# Patient Record
Sex: Male | Born: 1935 | Race: White | Hispanic: No | Marital: Married | Smoking: Current every day smoker
Health system: Southern US, Community
[De-identification: ages and names within clinical notes are randomized; demographics above are authoritative.]

## PROBLEM LIST (undated history)

## (undated) DIAGNOSIS — I3139 Other pericardial effusion (noninflammatory): Secondary | ICD-10-CM

## (undated) DIAGNOSIS — I10 Essential (primary) hypertension: Secondary | ICD-10-CM

## (undated) DIAGNOSIS — I313 Pericardial effusion (noninflammatory): Secondary | ICD-10-CM

## (undated) DIAGNOSIS — C8590 Non-Hodgkin lymphoma, unspecified, unspecified site: Secondary | ICD-10-CM

## (undated) DIAGNOSIS — I639 Cerebral infarction, unspecified: Secondary | ICD-10-CM

## (undated) DIAGNOSIS — I2510 Atherosclerotic heart disease of native coronary artery without angina pectoris: Secondary | ICD-10-CM

## (undated) DIAGNOSIS — C439 Malignant melanoma of skin, unspecified: Secondary | ICD-10-CM

## (undated) DIAGNOSIS — M5136 Other intervertebral disc degeneration, lumbar region: Secondary | ICD-10-CM

## (undated) DIAGNOSIS — N400 Enlarged prostate without lower urinary tract symptoms: Secondary | ICD-10-CM

## (undated) DIAGNOSIS — E785 Hyperlipidemia, unspecified: Secondary | ICD-10-CM

## (undated) DIAGNOSIS — I779 Disorder of arteries and arterioles, unspecified: Secondary | ICD-10-CM

## (undated) DIAGNOSIS — N189 Chronic kidney disease, unspecified: Secondary | ICD-10-CM

## (undated) DIAGNOSIS — I739 Peripheral vascular disease, unspecified: Secondary | ICD-10-CM

## (undated) DIAGNOSIS — M51369 Other intervertebral disc degeneration, lumbar region without mention of lumbar back pain or lower extremity pain: Secondary | ICD-10-CM

## (undated) HISTORY — DX: Other pericardial effusion (noninflammatory): I31.39

## (undated) HISTORY — DX: Peripheral vascular disease, unspecified: I73.9

## (undated) HISTORY — DX: Non-Hodgkin lymphoma, unspecified, unspecified site: C85.90

## (undated) HISTORY — DX: Benign prostatic hyperplasia without lower urinary tract symptoms: N40.0

## (undated) HISTORY — PX: CORONARY ANGIOPLASTY WITH STENT PLACEMENT: SHX49

## (undated) HISTORY — DX: Essential (primary) hypertension: I10

## (undated) HISTORY — PX: CHOLECYSTECTOMY: SHX55

## (undated) HISTORY — DX: Other intervertebral disc degeneration, lumbar region without mention of lumbar back pain or lower extremity pain: M51.369

## (undated) HISTORY — PX: PERICARDIAL WINDOW: SHX2213

## (undated) HISTORY — DX: Pericardial effusion (noninflammatory): I31.3

## (undated) HISTORY — DX: Disorder of arteries and arterioles, unspecified: I77.9

## (undated) HISTORY — DX: Atherosclerotic heart disease of native coronary artery without angina pectoris: I25.10

## (undated) HISTORY — DX: Hyperlipidemia, unspecified: E78.5

## (undated) HISTORY — DX: Cerebral infarction, unspecified: I63.9

## (undated) HISTORY — DX: Other intervertebral disc degeneration, lumbar region: M51.36

## (undated) HISTORY — DX: Chronic kidney disease, unspecified: N18.9

---

## 2003-05-24 HISTORY — PX: OTHER SURGICAL HISTORY: SHX169

## 2011-04-20 ENCOUNTER — Telehealth: Payer: Self-pay | Admitting: Oncology

## 2011-04-20 NOTE — Telephone Encounter (Signed)
S/w pt and his wife re appt for 11/30 @ 1:30 pm w/FS.

## 2011-04-21 ENCOUNTER — Encounter: Payer: Self-pay | Admitting: *Deleted

## 2011-04-21 ENCOUNTER — Encounter: Payer: Self-pay | Admitting: Cardiology

## 2011-04-21 ENCOUNTER — Ambulatory Visit (INDEPENDENT_AMBULATORY_CARE_PROVIDER_SITE_OTHER): Payer: Medicare HMO | Admitting: Cardiology

## 2011-04-21 VITALS — BP 148/53 | HR 68 | Ht 70.0 in | Wt 168.1 lb

## 2011-04-21 DIAGNOSIS — I10 Essential (primary) hypertension: Secondary | ICD-10-CM

## 2011-04-21 DIAGNOSIS — E782 Mixed hyperlipidemia: Secondary | ICD-10-CM

## 2011-04-21 DIAGNOSIS — I2510 Atherosclerotic heart disease of native coronary artery without angina pectoris: Secondary | ICD-10-CM | POA: Insufficient documentation

## 2011-04-21 DIAGNOSIS — I779 Disorder of arteries and arterioles, unspecified: Secondary | ICD-10-CM | POA: Insufficient documentation

## 2011-04-21 DIAGNOSIS — I739 Peripheral vascular disease, unspecified: Secondary | ICD-10-CM

## 2011-04-21 NOTE — Assessment & Plan Note (Signed)
Reportedly nonobstructive based on testing back in May of this year. Continue medical therapy at this time. Would anticipate a followup carotid Doppler in approximately 6 months.

## 2011-04-21 NOTE — Assessment & Plan Note (Signed)
Not reporting a progressive claudication, despite what sounds like history of graft failure and possibly stent occlusion involving the right leg. He is status post aortobifemoral bypass grafting as well as femoral to popliteal bypass grafting. We will continue medical therapy and consider followup arterial Dopplers with ABIs in approximately 6 months.

## 2011-04-21 NOTE — Assessment & Plan Note (Signed)
Has been symptomatically stable for some time now based on his report. ECG is normal today. Medications are reasonable. He reports having a nuclear stress test within the last 2 years, and therefore observation will be continued at this time without additional testing. Six-month visit to be scheduled.

## 2011-04-21 NOTE — Assessment & Plan Note (Signed)
Blood pressure mildly elevated today. No changes made to current regimen. Sodium restriction recommended. Continue follow up with primary care.

## 2011-04-21 NOTE — Assessment & Plan Note (Signed)
Recent lipid numbers reviewed and LDL is at goal.

## 2011-04-21 NOTE — Progress Notes (Signed)
Clinical Summary [name] is a 77 y.o.male presenting to establish cardiology followup. He and his wife have recently moved from [place] to the [place] area to be closer to family. Medical history is fairly complex, and is outlined below following review of available records.  From a cardiac perspective, [name] has done relatively well following interventions back in 2005. He states that he had a followup nuclear stress test within the last one to 2 years. He denies any angina, and describes NYHA class II dyspnea on exertion.  Recent lab work showed hemoglobin 13.6, platelets 173, potassium 5.4, BUN 31, creatinine 1.9, AST 20, ALT 10, cholesterol 146, triglycerides 243, HDL 29, LDL 68.  He has a history of elevated PSA, and has pending follow up with a urologist locally. He is also being referred for oncology followup of his non-Hodgkin's lymphoma. He states that he has not undergone any specific treatment for this.  Remarkably, he reports no major claudication symptoms. States that he has a "occluded stent" clearly involving right-sided leg grafting.  He was hospitalized back in May of this year with a TIA that was ultimately managed medically. He underwent carotid Dopplers at that time and also a CT angiogram that ultimately showed no obstructive disease to require revascularization. He has been on Plavix long-term.  No Known Allergies  Medication list reviewed.  Past Medical History  Diagnosis Date  . Coronary atherosclerosis of native coronary artery     Occluded RCA with collaterals, DES to LAD and circumflex 2005  . Essential hypertension, benign   . Non Hodgkin's lymphoma   . Chronic renal insufficiency   . Hyperlipidemia   . Degenerative disc disease, lumbar   . Duodenal ulcer     EGD 6/11 - iron deficiency anemia  . Pericardial effusion     Status post window  . Carotid artery disease     Nonobstructive, less than 50% LICA by angiography 5/12  . Stroke    Occurred 1999, had left atrial appendage thrombus at that time - previously on Coumadin  . Peripheral arterial disease     Past Surgical History  Procedure Date  . Cholecystectomy   . Pericardial window   . Aortobifemoral bypass grafting 2005  . Left femoral popliteal bypass grafting 2005    Family History  Problem Relation Age of Onset  . COPD Mother   . Coronary artery disease Mother     Social History [name] reports that he has quit smoking. His smoking use included Cigarettes. He has never used smokeless tobacco. [name] reports that he does not drink alcohol.  Review of Systems No palpitations, no falls or syncope. Reports stable appetite. No fevers or chills, no cough. No reported bleeding problems. Otherwise negative.  Physical Examination Filed Vitals:   04/21/11 0902  BP: 148/53  Pulse: 68   Chronically ill-appearing elderly male in no acute distress. HEENT: Conjunctiva and lids normal, oropharynx with moist mucosa. Neck: Supple, no elevated JVP, bilateral carotid bruits are noted, no thyromegaly. Lungs: Decreased breath sounds, nonlabored breathing. Cardiac: Regular rate and rhythm with 2/6 systolic murmur, no pericardial rub or S3 gallop. Abdomen: Protuberant, bowel sounds present, no rebound or guarding. Extremities: No pitting edema, distal pulses diminished, venous stasis and some cyanosis noted involving the feet. Skin: Warm and dry. Musculoskeletal: Mild kyphosis. Neuropsychiatric: Alert and oriented x3, affect appropriate.  ECG Normal sinus rhythm at 68.    Problem List and Plan

## 2011-04-21 NOTE — Patient Instructions (Signed)
**Note De-identified Cornelius Marullo Obfuscation** Your physician recommends that you continue on your current medications as directed. Please refer to the Current Medication list given to you today.  Your physician recommends that you schedule a follow-up appointment in: 6 months  

## 2011-04-22 ENCOUNTER — Other Ambulatory Visit (HOSPITAL_COMMUNITY): Payer: Self-pay | Admitting: Urology

## 2011-04-22 ENCOUNTER — Encounter: Payer: Self-pay | Admitting: Cardiology

## 2011-04-22 ENCOUNTER — Other Ambulatory Visit: Payer: Self-pay

## 2011-04-22 ENCOUNTER — Encounter: Payer: Self-pay | Admitting: Oncology

## 2011-04-22 ENCOUNTER — Ambulatory Visit: Payer: Self-pay

## 2011-04-22 DIAGNOSIS — N2889 Other specified disorders of kidney and ureter: Secondary | ICD-10-CM

## 2011-04-22 NOTE — Progress Notes (Signed)
This encounter was created in error - please disregard.

## 2011-04-28 ENCOUNTER — Ambulatory Visit (HOSPITAL_COMMUNITY)
Admission: RE | Admit: 2011-04-28 | Discharge: 2011-04-28 | Disposition: A | Discharge status: Home / Self Care | Payer: Medicare HMO | Source: Ambulatory Visit | Attending: Urology | Admitting: Urology

## 2011-04-28 ENCOUNTER — Ambulatory Visit (HOSPITAL_COMMUNITY): Payer: Medicare HMO

## 2011-04-28 DIAGNOSIS — R9389 Abnormal findings on diagnostic imaging of other specified body structures: Secondary | ICD-10-CM | POA: Insufficient documentation

## 2011-04-28 DIAGNOSIS — N289 Disorder of kidney and ureter, unspecified: Secondary | ICD-10-CM | POA: Insufficient documentation

## 2011-04-28 DIAGNOSIS — N2889 Other specified disorders of kidney and ureter: Secondary | ICD-10-CM

## 2011-04-28 DIAGNOSIS — I10 Essential (primary) hypertension: Secondary | ICD-10-CM | POA: Insufficient documentation

## 2011-04-28 DIAGNOSIS — R935 Abnormal findings on diagnostic imaging of other abdominal regions, including retroperitoneum: Secondary | ICD-10-CM | POA: Insufficient documentation

## 2011-04-28 DIAGNOSIS — Z87898 Personal history of other specified conditions: Secondary | ICD-10-CM | POA: Insufficient documentation

## 2011-08-03 ENCOUNTER — Encounter (HOSPITAL_COMMUNITY): Payer: Self-pay | Admitting: Oncology

## 2011-08-03 ENCOUNTER — Encounter (HOSPITAL_COMMUNITY): Payer: Medicare HMO | Attending: Oncology | Admitting: Oncology

## 2011-08-03 VITALS — BP 164/56 | HR 76 | Temp 98.0°F | Ht 68.5 in | Wt 171.6 lb

## 2011-08-03 DIAGNOSIS — C8588 Other specified types of non-Hodgkin lymphoma, lymph nodes of multiple sites: Secondary | ICD-10-CM

## 2011-08-03 DIAGNOSIS — C8590 Non-Hodgkin lymphoma, unspecified, unspecified site: Secondary | ICD-10-CM

## 2011-08-03 NOTE — Progress Notes (Signed)
This is a debilitated 77 year old Caucasian gentleman who was diagnosed with low-grade B-cell follicular lymphoma in 2006 and was followed at the [facility] [place] in [place] by Dr. Salli Real. He has no B. symptomatology at this point in time either but has a question of partial hydronephrosis of the kidney with decreasing renal function. His most recent creatinine was 2.23 and a BUN was 36.  He is referred today through the courtesy of Dr. Lynnea Ferrier of [facility]  He had a normal CBC and except for the creatinine and BUN mentioned above on 07/20/2011. His cmet did not include an LDH. His oncologic review of systems is negative. His social history is positive for his first wife dying from metastatic breast cancer and his present wife and he had been married 32 years. They have no children but he has 4 children by his first marriage and they live in [place]. He is not especially close to them emotionally for various reasons.  He still smokes half a pack of cigarettes a day but has smoked as much as a pack a day for many many years. He does not use alcohol presently.  His physical exam today reveals a weight of 171 pounds and a height of 5 feet 8-1/2 inches. He is in no acute distress but he is sitting in a wheelchair. He has no radial pulse on the left wrist. He has a skin lesion that has been biopsied on the right forearm but he states was a melanoma. He has an epitrochlear lymph node on the right that is 1 x 1 cm. He has he has a 1 x 0.5 cm lymph node at the base of the left neck posteriorly in the left subclavicular/cervical fossa. I did not discover other lymph nodes. He had no hepatosplenomegaly. His lungs showed markedly diminished breath sounds. His heart showed a regular rhythm and rate without murmur rub or gallop. He had no gynecomastia. His lungs showed no edema. He had multiple abdominal scars from prior surgeries including abdominal aortic aneurysm bypass surgery. Bowel  sounds were normal to slightly diminished. He had upper and lower dental plates. His pupils did not respond to light were small but EOMs were intact. He was alert and oriented. Pulses in his feet were diminished to absent.  This gentleman may have slight hydronephrosis on the left after my discussion with Dr. Kearney Hard in radiology. A CAT scan would be ideal for further assessment but not absolutely critical. We may just repeat an ultrasound of his abdomen in the future but I would be very reluctant to treat him with chemotherapy unless it is absolutely necessary.  We will see him back in 6 months for followup with laboratory work at that time including CBC cmet LDH  I suspect his COPD and his vascular disease or for greater concerns from a medical standpoint that his lymphoma.

## 2011-08-03 NOTE — Patient Instructions (Signed)
Ricardo Schaefer  [id number] 1926-06-27   [facility]  Discharge Instructions  RECOMMENDATIONS MADE BY THE CONSULTANT AND ANY TEST RESULTS WILL BE SENT TO YOUR REFERRING DOCTOR.   EXAM FINDINGS BY MD TODAY AND SIGNS AND SYMPTOMS TO REPORT TO CLINIC OR PRIMARY MD: Will just monitor you over time.    MEDICATIONS PRESCRIBED: none   INSTRUCTIONS GIVEN AND DISCUSSED: Other :  Report any night sweats, new lumps, shortness of breath, recurrent infections, etc.  SPECIAL INSTRUCTIONS/FOLLOW-UP: Lab work Needed in 6 months and Return to Clinic to see MD in 6 months.   I acknowledge that I have been informed and understand all the instructions given to me and received a copy. I do not have any more questions at this time, but understand that I may call the Specialty Clinic at [facility] at [phone] during business hours should I have any further questions or need assistance in obtaining follow-up care.    __________________________________________  _____________  __________ Signature of Patient or Authorized Representative            Date                   Time    __________________________________________ Nurse's Signature

## 2011-09-20 ENCOUNTER — Emergency Department (HOSPITAL_COMMUNITY)
Admission: EM | Admit: 2011-09-20 | Discharge: 2011-09-20 | Disposition: A | Discharge status: Home / Self Care | Payer: Medicare HMO | Attending: Emergency Medicine | Admitting: Emergency Medicine

## 2011-09-20 ENCOUNTER — Encounter (HOSPITAL_COMMUNITY): Payer: Self-pay | Admitting: *Deleted

## 2011-09-20 DIAGNOSIS — I129 Hypertensive chronic kidney disease with stage 1 through stage 4 chronic kidney disease, or unspecified chronic kidney disease: Secondary | ICD-10-CM | POA: Insufficient documentation

## 2011-09-20 DIAGNOSIS — R011 Cardiac murmur, unspecified: Secondary | ICD-10-CM | POA: Insufficient documentation

## 2011-09-20 DIAGNOSIS — I2510 Atherosclerotic heart disease of native coronary artery without angina pectoris: Secondary | ICD-10-CM | POA: Insufficient documentation

## 2011-09-20 DIAGNOSIS — IMO0002 Reserved for concepts with insufficient information to code with codable children: Secondary | ICD-10-CM | POA: Insufficient documentation

## 2011-09-20 DIAGNOSIS — Z8673 Personal history of transient ischemic attack (TIA), and cerebral infarction without residual deficits: Secondary | ICD-10-CM | POA: Insufficient documentation

## 2011-09-20 DIAGNOSIS — Z79899 Other long term (current) drug therapy: Secondary | ICD-10-CM | POA: Insufficient documentation

## 2011-09-20 DIAGNOSIS — N189 Chronic kidney disease, unspecified: Secondary | ICD-10-CM | POA: Insufficient documentation

## 2011-09-20 DIAGNOSIS — R197 Diarrhea, unspecified: Secondary | ICD-10-CM | POA: Insufficient documentation

## 2011-09-20 DIAGNOSIS — R110 Nausea: Secondary | ICD-10-CM

## 2011-09-20 DIAGNOSIS — F17200 Nicotine dependence, unspecified, uncomplicated: Secondary | ICD-10-CM | POA: Insufficient documentation

## 2011-09-20 LAB — URINALYSIS, ROUTINE W REFLEX MICROSCOPIC
Bilirubin Urine: NEGATIVE
Glucose, UA: NEGATIVE mg/dL
Hgb urine dipstick: NEGATIVE
Ketones, ur: NEGATIVE mg/dL
Leukocytes, UA: NEGATIVE
Nitrite: NEGATIVE
Protein, ur: NEGATIVE mg/dL
Specific Gravity, Urine: <1.005 — ABNORMAL LOW (ref 1.005–1.030)
Urobilinogen, UA: 0.2 mg/dL (ref 0.0–1.0)
pH: 6 (ref 5.0–8.0)

## 2011-09-20 LAB — CBC
HCT: 38 % — ABNORMAL LOW (ref 39.0–52.0)
Hemoglobin: 12.6 g/dL — ABNORMAL LOW (ref 13.0–17.0)
MCH: 29.9 pg (ref 26.0–34.0)
MCHC: 33.2 g/dL (ref 30.0–36.0)
MCV: 90 fL (ref 78.0–100.0)
Platelets: 204 10*3/uL (ref 150–400)
RBC: 4.22 MIL/uL (ref 4.22–5.81)
RDW: 13.6 % (ref 11.5–15.5)
WBC: 6.5 10*3/uL (ref 4.0–10.5)

## 2011-09-20 LAB — BASIC METABOLIC PANEL
BUN: 29 mg/dL — ABNORMAL HIGH (ref 6–23)
CO2: 25 mEq/L (ref 19–32)
Calcium: 10.3 mg/dL (ref 8.4–10.5)
Chloride: 105 mEq/L (ref 96–112)
Creatinine, Ser: 1.78 mg/dL — ABNORMAL HIGH (ref 0.50–1.35)
GFR calc Af Amer: 39 mL/min — ABNORMAL LOW (ref >90)
GFR calc non Af Amer: 34 mL/min — ABNORMAL LOW (ref >90)
Glucose, Bld: 99 mg/dL (ref 70–99)
Potassium: 5 mEq/L (ref 3.5–5.1)
Sodium: 139 mEq/L (ref 135–145)

## 2011-09-20 MED ORDER — ONDANSETRON HCL 4 MG PO TABS: 4.0000 mg | ORAL_TABLET | Freq: Four times a day (QID) | ORAL | Status: AC

## 2011-09-20 MED ORDER — SODIUM CHLORIDE 0.9 % IV BOLUS (SEPSIS)
1000.0000 mL | Freq: Once | INTRAVENOUS | Status: AC
  Administered 2011-09-20: 1000 mL via INTRAVENOUS

## 2011-09-20 NOTE — ED Notes (Signed)
Diarrhea for weeks, states he was told to come in for fluids, also is nauseated

## 2011-09-20 NOTE — Discharge Instructions (Signed)
Chronic Diarrhea Diarrhea is loose, watery stools. Having diarrhea means passing loose stools 3 or more times a day. Diarrhea that lasts longer than 4 weeks is considered long-lasting (chronic). Symptoms of chronic diarrhea may be continual or may come and go. People of all ages can get diarrhea. Body fluid loss (dehydration) may occur as a result of diarrhea. This means the body does not have as many fluids and salts (electrolytes) as it needs. CAUSES  There are many causes of chronic diarrhea. Causes may be different for children and adults. The various causes can be grouped into 2 categories: diarrhea caused by an infection and diarrhea not caused by an infection. Sometimes, the cause is unknown. Diarrhea caused by an infection may result from:  Parasites.   Bacteria.   Viral infections.  Diarrhea not caused by an infection may result from:  Irritable bowel syndrome.   Reaction to medicines, such as antibiotics, cancer drugs, blood pressure medicines, and antacids.   Intestinal disease (Crohn's disease, ulcerative colitis, celiac disease).   Food allergies or sensitivity to additives (fructose, lactose, sugar substitutes).   Tumors.   Diabetes, thyroid disease, and other endocrine diseases.   Reduced blood flow to the intestine.   Previous surgery or radiation of the abdomen or gastrointestinal tract.  Risk factors for chronic diarrhea include:  Having a severely weakened immune system, such as from HIV/AIDS.   Taking certain types of cancer-fighting drugs (chemotherapy) or other medicines.   A recent organ transplant.   Having a portion of the stomach removed.   Traveling to countries where food and water supplies are often contaminated.  SYMPTOMS  In addition to frequent, loose stools, diarrhea may cause:  Cramping.   Abdominal pain.   Nausea.   Urgent need to use the bathroom, or loss of bowel control.  If dehydration occurs, problems include:  Thirst.    Less frequent urination.   Dark urine.   Dry skin.   Fatigue.   Dizziness.  Infections that cause diarrhea may also cause a fever, chills, or bloody stools. DIAGNOSIS  Diagnosis may be difficult. Your caregiver must take a careful history and perform a physical exam. Tests given are based on your symptoms and history. Tests may include:  Blood or stool tests, in which 3 or more stool samples may be examined. Stool cultures may be used to test for bacteria or parasites.   X-rays.   A procedure in which a thin tube is inserted into the mouth or rectum (endoscopy). This allows the caregiver to look inside the intestine.  TREATMENT   Diarrhea caused by an infection can often be treated with antibiotics.   Diarrhea not caused by an infection is more difficult to diagnose and treat. Long-term medicine use or surgery may be required. Specific treatment should be discussed with your caregiver.   If the cause cannot determined, treatment to relieve symptoms includes:   Preventing dehydration. Serious health problems can occur if you do not maintain proper fluid levels. Many oral rehydration solutions (ORS) are available at drug stores. Ask your caregiver what product is best for you.   Not drinking beverages that contain caffeine (tea, coffee, soft drinks).   Not drinking alcohol. It causes dehydration.   Not relying on sports drinks and broths alone to maintain proper fluid levels. They should not be used to prevent severe dehydration.   Maintaining well-balanced nutrition. This may help you recover faster.  PREVENTION   Drink clean or purified water.   Use proper   food handling techniques.   Maintain proper hand-washing habits.  HOME CARE INSTRUCTIONS   Avoid:   Caffeine.   Greasy foods.   High fiber.   If you have problems digesting lactose during or after an episode of diarrhea, you might want to try yogurt. Yogurt is often better tolerated, because it has less  lactose than milk. Yogurt with active, live bacterial cultures may even help you recover faster.  SEEK MEDICAL CARE IF:  The person with diarrhea is an otherwise healthy adult and has:  Signs of dehydration.   Diarrhea for more than 2 days.   Severe pain in the abdomen or rectum.   An oral temperature above 102 F (38.9 C).   Stools containing blood or pus.   Stools that are black and tarry.  SEEK IMMEDIATE MEDICAL CARE IF:  The person with diarrhea is a child, elderly person, or has a weakened immune system and has:  Signs of dehydration.   Diarrhea for more than 1 day.   Severe pain in the abdomen or rectum.   An oral temperature above 102 F (38.9 C), not controlled by medicine.   Stools containing blood or pus.   Stools that are black and tarry.  Document Released: 07/30/2003 Document Revised: 04/28/2011 Document Reviewed: 09/25/2009 [employer] Patient Information 2012 [employer]. 

## 2011-09-20 NOTE — ED Notes (Signed)
Pt is aware of needing a urine sample, pt states he is unable to void at this time. RN aware.

## 2011-09-20 NOTE — ED Provider Notes (Signed)
History    85yM with diarrhea. Onset around April 6th. Persistent since then. Has been evaluated by PCP for same and referred to GI but has yet to be seen by them. No blood in stool or melena. No fever or chills. Says has tried two different medications for his diarrhea but can't remember names. No sick contacts. No vomiting but did start having nausea today. Doesn't think has been on abx recently. No recent hospital admissions. Surgical hx significant for chole and fem bypass.  CSN: [id number]  Arrival date & time 09/20/11  1600   First MD Initiated Contact with Patient 09/20/11 1625      Chief Complaint  Patient presents with  . Diarrhea    (Consider location/radiation/quality/duration/timing/severity/associated sxs/prior treatment) HPI  Past Medical History  Diagnosis Date  . Coronary atherosclerosis of native coronary artery     Occluded RCA with collaterals, DES to LAD and circumflex 2005  . Essential hypertension, benign   . Non Hodgkin's lymphoma   . Chronic renal insufficiency   . Hyperlipidemia   . Degenerative disc disease, lumbar   . Duodenal ulcer     EGD 6/11 - iron deficiency anemia  . Pericardial effusion     Status post window  . Carotid artery disease     Nonobstructive, less than 50% LICA by angiography 5/12  . Stroke     Occurred 1999, had left atrial appendage thrombus at that time - previously on Coumadin  . Peripheral arterial disease   . Enlarged prostate     Past Surgical History  Procedure Date  . Cholecystectomy   . Pericardial window   . Aortobifemoral bypass grafting 2005  . Left femoral popliteal bypass grafting 2005  . Coronary angioplasty with stent placement     Family History  Problem Relation Age of Onset  . COPD Mother   . Coronary artery disease Mother     History  Substance Use Topics  . Smoking status: Current Everyday Smoker -- 0.5 packs/day    Types: Cigarettes  . Smokeless tobacco: Never Used  . Alcohol Use: No       Review of Systems   Review of symptoms negative unless otherwise noted in HPI.   Allergies  Review of patient's allergies indicates no known allergies.  Home Medications   Current Outpatient Rx  Name Route Sig Dispense Refill  . ALPRAZOLAM 0.5 MG PO TABS Oral Take 0.5 mg by mouth 3 (three) times daily.      . CHLORPHENIRAMINE MALEATE 4 MG PO TABS Oral Take 4 mg by mouth daily.    Marland Kitchen CLOPIDOGREL BISULFATE 75 MG PO TABS Oral Take 75 mg by mouth every morning.     Marland Kitchen HYDRALAZINE HCL 25 MG PO TABS Oral Take 25 mg by mouth 2 (two) times daily.    Marland Kitchen HYDROCODONE-ACETAMINOPHEN 5-500 MG PO TABS Oral Take 1 tablet by mouth every 6 (six) hours as needed. For pain    . LOVASTATIN 40 MG PO TABS Oral Take 40 mg by mouth at bedtime.     Marland Kitchen METOPROLOL TARTRATE 50 MG PO TABS Oral Take 50 mg by mouth 2 (two) times daily.     Marland Kitchen NIFEDIPINE ER OSMOTIC 60 MG PO TB24 Oral Take 60 mg by mouth every morning.     Marland Kitchen PANTOPRAZOLE SODIUM 40 MG PO TBEC Oral Take 40 mg by mouth every morning.     Marland Kitchen TAMSULOSIN HCL 0.4 MG PO CAPS Oral Take 0.4 mg by mouth daily after supper.     Marland Kitchen  VENLAFAXINE HCL ER 150 MG PO CP24 Oral Take 150 mg by mouth every morning.       BP 155/35  Pulse 69  Temp(Src) 98.4 F (36.9 C) (Oral)  Resp 18  Ht 5\' 9"  (1.753 m)  Wt 167 lb (75.751 kg)  BMI 24.66 kg/m2  SpO2 94%  Physical Exam  Nursing note and vitals reviewed. Constitutional: He appears well-developed and well-nourished. No distress.       Laying in bed. No acute distress.  HENT:  Head: Normocephalic and atraumatic.  Right Ear: External ear normal.  Left Ear: External ear normal.  Eyes: Conjunctivae are normal. Pupils are equal, round, and reactive to light. Right eye exhibits no discharge. Left eye exhibits no discharge.  Neck: Neck supple.  Cardiovascular: Normal rate and regular rhythm.  Exam reveals no gallop and no friction rub.   Murmur heard.      Systolic murmur  Pulmonary/Chest: Effort normal and breath  sounds normal. No respiratory distress.  Abdominal: Soft. He exhibits no distension. There is no tenderness.       Well-healed transverse and vertical abdominal surgical incisions. No distention. No tenderness. No mass palpated  Musculoskeletal: He exhibits no edema and no tenderness.  Neurological: He is alert.  Skin: Skin is warm and dry. He is not diaphoretic.  Psychiatric: He has a normal mood and affect. His behavior is normal. Thought content normal.    ED Course  Procedures (including critical care time)  Labs Reviewed  URINALYSIS, ROUTINE W REFLEX MICROSCOPIC - Abnormal; Notable for the following:    Color, Urine STRAW (*)    Specific Gravity, Urine <1.005 (*)    All other components within normal limits  CBC - Abnormal; Notable for the following:    Hemoglobin 12.6 (*)    HCT 38.0 (*)    All other components within normal limits  BASIC METABOLIC PANEL - Abnormal; Notable for the following:    BUN 29 (*)    Creatinine, Ser 1.78 (*)    GFR calc non Af Amer 34 (*)    GFR calc Af Amer 39 (*)    All other components within normal limits  STOOL CULTURE  CLOSTRIDIUM DIFFICILE CULTURE-FECAL   No results found.   1. Diarrhea   2. Nausea       MDM  78 year old male with persistent diarrhea. Stool is nonbloody. Consider C. difficile but less likely. No leukocytosis as is typical with Clostridium difficile. Patient has not been admitted to the hospital in about a year either. Clinically he is well hydrated. Renal insufficiency noted but does not appear to be changed. Review of records shows a creatinine of 1.9 in November of 2012. He is not tachycardic although he is on rate controlling medication. The specific gravity is actually low. He is not febrile or hypotensive. Abdominal exam is benign. Patient received IV fluids. No stool in the emergency room for stool studies. Feel that patient can followup as an outpatient. He and wife report that PCP has already referred to GI but  has not had appointment yet. Return precautions were discussed.        Raeford Razor, MD 09/21/11 1256

## 2011-09-20 NOTE — ED Notes (Signed)
Patient states he has been unable to give [place] a stool sample. Wife states she gave patient OTC Immodium before arrival to ED.

## 2011-10-18 ENCOUNTER — Other Ambulatory Visit: Payer: Self-pay | Admitting: Cardiology

## 2011-10-18 ENCOUNTER — Encounter: Payer: Self-pay | Admitting: Cardiology

## 2011-10-18 ENCOUNTER — Ambulatory Visit (INDEPENDENT_AMBULATORY_CARE_PROVIDER_SITE_OTHER): Payer: Medicare HMO | Admitting: Cardiology

## 2011-10-18 VITALS — BP 147/63 | HR 80 | Resp 16 | Ht 69.0 in | Wt 166.0 lb

## 2011-10-18 DIAGNOSIS — I739 Peripheral vascular disease, unspecified: Secondary | ICD-10-CM

## 2011-10-18 DIAGNOSIS — E782 Mixed hyperlipidemia: Secondary | ICD-10-CM

## 2011-10-18 DIAGNOSIS — I779 Disorder of arteries and arterioles, unspecified: Secondary | ICD-10-CM

## 2011-10-18 DIAGNOSIS — I2510 Atherosclerotic heart disease of native coronary artery without angina pectoris: Secondary | ICD-10-CM

## 2011-10-18 DIAGNOSIS — I10 Essential (primary) hypertension: Secondary | ICD-10-CM

## 2011-10-18 NOTE — Assessment & Plan Note (Signed)
Blood pressure elevated today, however trend is better than before based on discussion with patient and wife. He seems to be tolerating the present regimen. No changes were made today.

## 2011-10-18 NOTE — Progress Notes (Signed)
Clinical Summary Ricardo Schaefer is a 78 y.o.male presenting for followup. He was seen in November 2012. In the interim he reports difficulty with blood pressure control, tried 3 different medications which he did not tolerate, ultimately settling on the regimen outlined below. He has not had any angina, no progressive shortness of breath. Continues to report claudication, worse in the right leg.  Lab work from April showed hemoglobin 12.6, platelets 204, sodium 139, potassium 5.0, BUN 29, creatinine 1.7.  He reports no bleeding problems with Plavix. No new motor deficits, speech deficits, or memory problems.  He is due for followup carotid Dopplers and lower extremity ABIs.  No Known Allergies  Current Outpatient Prescriptions  Medication Sig Dispense Refill  . ALPRAZolam (XANAX) 0.5 MG tablet Take 0.5 mg by mouth 3 (three) times daily.        . clopidogrel (PLAVIX) 75 MG tablet Take 75 mg by mouth every morning.       Marland Kitchen doxazosin (CARDURA) 4 MG tablet Take 4 mg by mouth at bedtime.      . hydrochlorothiazide (MICROZIDE) 12.5 MG capsule Take 12.5 mg by mouth daily.      Marland Kitchen HYDROcodone-acetaminophen (LORTAB 5) 5-500 MG per tablet Take 1 tablet by mouth every 6 (six) hours as needed. For pain      . lovastatin (MEVACOR) 40 MG tablet Take 40 mg by mouth at bedtime.       . metoprolol (LOPRESSOR) 50 MG tablet Take 50 mg by mouth 2 (two) times daily.       Marland Kitchen NIFEdipine (PROCARDIA XL/ADALAT-CC) 60 MG 24 hr tablet Take 60 mg by mouth every morning.       . pantoprazole (PROTONIX) 40 MG tablet Take 40 mg by mouth every morning.       . Tamsulosin HCl (FLOMAX) 0.4 MG CAPS Take 0.4 mg by mouth daily after supper.       . venlafaxine (EFFEXOR-XR) 150 MG 24 hr capsule Take 150 mg by mouth every morning.         Past Medical History  Diagnosis Date  . Coronary atherosclerosis of native coronary artery     Occluded RCA with collaterals, DES to LAD and circumflex 2005  . Essential hypertension, benign    . Non Hodgkin's lymphoma   . Chronic renal insufficiency   . Hyperlipidemia   . Degenerative disc disease, lumbar   . Duodenal ulcer     EGD 6/11 - iron deficiency anemia  . Pericardial effusion     Status post window  . Carotid artery disease     Nonobstructive, less than 50% LICA by angiography 5/12  . Stroke     Occurred 1999, had left atrial appendage thrombus at that time - previously on Coumadin  . Peripheral arterial disease   . Enlarged prostate     Past Surgical History  Procedure Date  . Cholecystectomy   . Pericardial window   . Aortobifemoral bypass grafting 2005  . Left femoral popliteal bypass grafting 2005  . Coronary angioplasty with stent placement     Social History Ricardo Schaefer reports that he has been smoking Cigarettes.  He has been smoking about .5 packs per day. He has never used smokeless tobacco. Ricardo Schaefer reports that he does not drink alcohol.  Review of Systems He has had no palpitations or dizziness. He reports no melena or hematochezia. No cough, fevers or chills. Otherwise as outlined above.  Physical Examination Filed Vitals:   10/18/11 0852  BP:  147/63  Pulse: 80  Resp: 16    Elderly male in no acute distress.  HEENT: Conjunctiva and lids normal, oropharynx with moist mucosa.  Neck: Supple, no elevated JVP, bilateral carotid bruits are noted, no thyromegaly.  Lungs: Decreased breath sounds, nonlabored breathing.  Cardiac: Regular rate and rhythm with 2/6 systolic murmur, no pericardial rub or S3 gallop.  Abdomen: Protuberant, bowel sounds present, no rebound or guarding.  Extremities: No pitting edema, distal pulses diminished, venous stasis and some cyanosis noted involving the feet.  Skin: Warm and dry.  Musculoskeletal: Mild kyphosis.  Neuropsychiatric: Alert and oriented x3, affect appropriate.   Problem List and Plan   Coronary atherosclerosis of native coronary artery Symptomatically stable without progressive angina.  Continue symptom observation and medical therapy. Warning signs have been discussed.  Essential hypertension, benign Blood pressure elevated today, however trend is better than before based on discussion with patient and wife. He seems to be tolerating the present regimen. No changes were made today.  Mixed hyperlipidemia Keep followup with Dr. Tanya Nones. Goal LDL should be under 100.  Peripheral arterial disease He is reporting progressive claudication, right worse and left. Has history of graft failure and possibly stent occlusion involving the right leg. He is status post aortobifemoral bypass grafting as well as femoral to popliteal bypass grafting. Followup lower extremity arterial Doppler studies to be obtained. Will likely need referral to our vascular interventionalists for consultation.   Carotid artery disease Due for followup carotid Dopplers. These will be arranged.     Jonelle Sidle, M.D., F.A.C.C.

## 2011-10-18 NOTE — Patient Instructions (Signed)
**Note De-Identified Ricardo Schaefer Obfuscation** Your physician has requested that you have a lower or upper extremity venous duplex. This test is an ultrasound of the veins in the legs or arms. It looks at venous blood flow that carries blood from the heart to the legs or arms. Allow one hour for a Lower Venous exam. Allow thirty minutes for an Upper Venous exam. There are no restrictions or special instructions.  Your physician has requested that you have a carotid duplex. This test is an ultrasound of the carotid arteries in your neck. It looks at blood flow through these arteries that supply the brain with blood. Allow one hour for this exam. There are no restrictions or special instructions.  Your physician recommends that you schedule a follow-up appointment in: 6 months

## 2011-10-18 NOTE — Assessment & Plan Note (Signed)
Due for follow-up carotid Dopplers. These will be arranged. 

## 2011-10-18 NOTE — Assessment & Plan Note (Signed)
He is reporting progressive claudication, right worse and left. Has history of graft failure and possibly stent occlusion involving the right leg. He is status post aortobifemoral bypass grafting as well as femoral to popliteal bypass grafting. Followup lower extremity arterial Doppler studies to be obtained. Will likely need referral to our vascular interventionalists for consultation.

## 2011-10-18 NOTE — Assessment & Plan Note (Signed)
Keep followup with Dr. Tanya Nones. Goal LDL should be under 100.

## 2011-10-18 NOTE — Assessment & Plan Note (Signed)
Symptomatically stable without progressive angina. Continue symptom observation and medical therapy. Warning signs have been discussed.

## 2011-10-20 ENCOUNTER — Ambulatory Visit (HOSPITAL_COMMUNITY)
Admission: RE | Admit: 2011-10-20 | Discharge: 2011-10-20 | Disposition: A | Discharge status: Home / Self Care | Payer: Medicare HMO | Source: Ambulatory Visit | Attending: Cardiology | Admitting: Cardiology

## 2011-10-20 ENCOUNTER — Other Ambulatory Visit: Payer: Self-pay | Admitting: Cardiology

## 2011-10-20 DIAGNOSIS — Z951 Presence of aortocoronary bypass graft: Secondary | ICD-10-CM | POA: Insufficient documentation

## 2011-10-20 DIAGNOSIS — I779 Disorder of arteries and arterioles, unspecified: Secondary | ICD-10-CM

## 2011-10-20 DIAGNOSIS — I2510 Atherosclerotic heart disease of native coronary artery without angina pectoris: Secondary | ICD-10-CM | POA: Insufficient documentation

## 2011-11-09 ENCOUNTER — Encounter: Payer: Self-pay | Admitting: Cardiovascular Disease

## 2011-11-09 ENCOUNTER — Ambulatory Visit (INDEPENDENT_AMBULATORY_CARE_PROVIDER_SITE_OTHER): Payer: Medicare HMO | Admitting: Cardiovascular Disease

## 2011-11-09 VITALS — BP 134/48 | HR 80 | Ht 69.0 in | Wt 169.0 lb

## 2011-11-09 DIAGNOSIS — I739 Peripheral vascular disease, unspecified: Secondary | ICD-10-CM

## 2011-11-09 DIAGNOSIS — I779 Disorder of arteries and arterioles, unspecified: Secondary | ICD-10-CM

## 2011-11-09 NOTE — Patient Instructions (Addendum)
Your physician has requested that you have a lower extremity arterial duplex. During this test, exercise and ultrasound are used to evaluate arterial blood flow in the legs. Allow one hour for this exam. There are no restrictions or special instructions  Your physician has requested that you have an abdominal aorta iliac duplex. During this test, an ultrasound is used to evaluate the aorta. Allow 30 minutes for this exam. Do not eat after midnight the day before and avoid carbonated beverages

## 2011-11-21 ENCOUNTER — Encounter (HOSPITAL_COMMUNITY): Payer: Self-pay | Admitting: *Deleted

## 2011-11-21 ENCOUNTER — Emergency Department (HOSPITAL_COMMUNITY)
Admission: EM | Admit: 2011-11-21 | Discharge: 2011-11-21 | Disposition: A | Discharge status: Home / Self Care | Payer: Medicare HMO | Attending: Emergency Medicine | Admitting: Emergency Medicine

## 2011-11-21 DIAGNOSIS — F17200 Nicotine dependence, unspecified, uncomplicated: Secondary | ICD-10-CM | POA: Insufficient documentation

## 2011-11-21 DIAGNOSIS — R062 Wheezing: Secondary | ICD-10-CM | POA: Insufficient documentation

## 2011-11-21 DIAGNOSIS — N189 Chronic kidney disease, unspecified: Secondary | ICD-10-CM | POA: Insufficient documentation

## 2011-11-21 DIAGNOSIS — Z9861 Coronary angioplasty status: Secondary | ICD-10-CM | POA: Insufficient documentation

## 2011-11-21 DIAGNOSIS — I129 Hypertensive chronic kidney disease with stage 1 through stage 4 chronic kidney disease, or unspecified chronic kidney disease: Secondary | ICD-10-CM | POA: Insufficient documentation

## 2011-11-21 DIAGNOSIS — R110 Nausea: Secondary | ICD-10-CM | POA: Insufficient documentation

## 2011-11-21 DIAGNOSIS — Z8673 Personal history of transient ischemic attack (TIA), and cerebral infarction without residual deficits: Secondary | ICD-10-CM | POA: Insufficient documentation

## 2011-11-21 DIAGNOSIS — I2510 Atherosclerotic heart disease of native coronary artery without angina pectoris: Secondary | ICD-10-CM | POA: Insufficient documentation

## 2011-11-21 DIAGNOSIS — Z79899 Other long term (current) drug therapy: Secondary | ICD-10-CM | POA: Insufficient documentation

## 2011-11-21 DIAGNOSIS — R231 Pallor: Secondary | ICD-10-CM | POA: Insufficient documentation

## 2011-11-21 DIAGNOSIS — R42 Dizziness and giddiness: Secondary | ICD-10-CM | POA: Insufficient documentation

## 2011-11-21 DIAGNOSIS — E785 Hyperlipidemia, unspecified: Secondary | ICD-10-CM | POA: Insufficient documentation

## 2011-11-21 HISTORY — DX: Malignant melanoma of skin, unspecified: C43.9

## 2011-11-21 HISTORY — DX: Non-Hodgkin lymphoma, unspecified, unspecified site: C85.90

## 2011-11-21 LAB — COMPREHENSIVE METABOLIC PANEL
ALT: 10 U/L (ref 0–53)
AST: 20 U/L (ref 0–37)
Albumin: 3.6 g/dL (ref 3.5–5.2)
Alkaline Phosphatase: 77 U/L (ref 39–117)
Potassium: 4.2 mEq/L (ref 3.5–5.1)
Sodium: 134 mEq/L — ABNORMAL LOW (ref 135–145)
Total Protein: 6.6 g/dL (ref 6.0–8.3)

## 2011-11-21 LAB — CBC WITH DIFFERENTIAL/PLATELET
Basophils Relative: 0 % (ref 0–1)
Eosinophils Absolute: 0.2 10*3/uL (ref 0.0–0.7)
MCH: 30.1 pg (ref 26.0–34.0)
MCHC: 33 g/dL (ref 30.0–36.0)
Neutro Abs: 5.8 10*3/uL (ref 1.7–7.7)
Neutrophils Relative %: 74 % (ref 43–77)
Platelets: 147 10*3/uL — ABNORMAL LOW (ref 150–400)
RBC: 4.35 MIL/uL (ref 4.22–5.81)

## 2011-11-21 LAB — URINALYSIS, MICROSCOPIC ONLY
Bilirubin Urine: NEGATIVE
Glucose, UA: NEGATIVE mg/dL
Hgb urine dipstick: NEGATIVE
Nitrite: NEGATIVE
Specific Gravity, Urine: 1.01 (ref 1.005–1.030)
pH: 6 (ref 5.0–8.0)

## 2011-11-21 MED ORDER — ONDANSETRON HCL 4 MG PO TABS: 4.0000 mg | ORAL_TABLET | Freq: Four times a day (QID) | ORAL | Status: AC

## 2011-11-21 MED ORDER — MECLIZINE HCL 12.5 MG PO TABS
25.0000 mg | ORAL_TABLET | Freq: Once | ORAL | Status: AC
  Administered 2011-11-21: 25 mg via ORAL
  Filled 2011-11-21: qty 2

## 2011-11-21 MED ORDER — SODIUM CHLORIDE 0.9 % IV SOLN
INTRAVENOUS | Status: DC
  Administered 2011-11-21: 15:00:00 via INTRAVENOUS

## 2011-11-21 MED ORDER — MECLIZINE HCL 25 MG PO TABS: ORAL_TABLET | ORAL | Status: DC

## 2011-11-21 MED ORDER — ONDANSETRON HCL 4 MG/2ML IJ SOLN
4.0000 mg | Freq: Once | INTRAMUSCULAR | Status: AC
  Administered 2011-11-21: 4 mg via INTRAVENOUS
  Filled 2011-11-21: qty 2

## 2011-11-21 NOTE — ED Notes (Signed)
Patient complained of being dizzy when changing from a lying to sitting position.

## 2011-11-21 NOTE — Discharge Instructions (Signed)
Take the antivert (meclizine) for dizziness. Use the zofran for nausea or vomiting. Recheck if you get a headache or you seem worse.

## 2011-11-21 NOTE — ED Notes (Signed)
Dizzy, weak, nausea, for 1 week. Not sleeping well

## 2011-11-21 NOTE — ED Notes (Signed)
Patient complained of being dizzy when changing from a sitting to standing position.

## 2011-11-21 NOTE — ED Provider Notes (Signed)
History   This chart was scribed for Ward Givens, MD by Sofie Rower. The patient was seen in room APA11/[facility]11 and the patient's care was started at 2:08 PM    CSN: [id number]  Arrival date & time 11/21/11  1349   First MD Initiated Contact with Patient 11/21/11 1404      Chief Complaint  Patient presents with  . Nausea    (Consider location/radiation/quality/duration/timing/severity/associated sxs/prior treatment) HPI  Ricardo Schaefer is a 78 y.o. male who presents to the Emergency Department complaining of anxiety onset nine days ago with associated symptoms of weakness, intermitant blurred vision, nausea (onset one week ago), occasional sharp chest pain that is a quick jab and gone, productive clear cough. PT c/o dizziness which is both a spinning feeling and a feeling of passing out. Standing up makes it worse. The pt informs the EDP that he began having panic attacks a year ago which induce nervousness and an inability to remain still. The pt reports that when watching TV, the image becomes blurred off and on. The pt wife informs the EDP that the pt has been walking around staggering. The pt has recently undergone a xanax dosage increase to 1 mg TID a month ago. Modifying factors include taking xanax which provides moderate relief, standing up which intensifies the dizziness. Pt has a hx of anxiety (onset one year ago), mini strokes (April, May, symptoms include inability to walk, move right leg, falling). Wife states today he was staggering and too weak to walk.   Pt denies receiving psycho- therapy for anxiety, utilization of an inhaler, vomiting, diarrhea, abdominal pain, difficulty urinating, fever.   PCP is Dr. Tanya Nones, [facility]   Past Medical History  Diagnosis Date  . Coronary atherosclerosis of native coronary artery     Occluded RCA with collaterals, DES to LAD and circumflex 2005  . Essential hypertension, benign   . Non Hodgkin's lymphoma   . Chronic renal  insufficiency   . Hyperlipidemia   . Degenerative disc disease, lumbar   . Duodenal ulcer     EGD 6/11 - iron deficiency anemia  . Pericardial effusion     Status post window  . Carotid artery disease     Nonobstructive, less than 50% LICA by angiography 5/12  . Stroke     Occurred 1999, had left atrial appendage thrombus at that time - previously on Coumadin  . Peripheral arterial disease   . Enlarged prostate   . Lymphoma   . Melanoma     Past Surgical History  Procedure Date  . Cholecystectomy   . Pericardial window   . Aortobifemoral bypass grafting 2005  . Left femoral popliteal bypass grafting 2005  . Coronary angioplasty with stent placement     Family History  Problem Relation Age of Onset  . COPD Mother   . Coronary artery disease Mother     History  Substance Use Topics  . Smoking status: Current Everyday Smoker -- 0.5 packs/day    Types: Cigarettes  . Smokeless tobacco: Never Used  . Alcohol Use: No    Pt is a smoker.  Pt does not drink.  Pt walks with a cane.  Pt has been married for 30 years.   Review of Systems  All other systems reviewed and are negative.    10 Systems reviewed and all are negative for acute change except as noted in the HPI.    Allergies  Review of patient's allergies indicates no known allergies.  Home Medications   Current Outpatient Rx  Name Route Sig Dispense Refill  . ALPRAZOLAM 1 MG PO TABS Oral Take 1 tablet by mouth Three times daily as needed.    . CLOPIDOGREL BISULFATE 75 MG PO TABS Oral Take 75 mg by mouth every morning.     Marland Kitchen DOXAZOSIN MESYLATE 4 MG PO TABS Oral Take 4 mg by mouth at bedtime.    Marland Kitchen HYDRALAZINE HCL 25 MG PO TABS Oral Take 1 tablet by mouth Twice daily.    Marland Kitchen HYDROCHLOROTHIAZIDE 12.5 MG PO CAPS Oral Take 12.5 mg by mouth daily.    Marland Kitchen HYDROCODONE-ACETAMINOPHEN 5-500 MG PO TABS Oral Take 1 tablet by mouth every 6 (six) hours as needed. For pain    . LOVASTATIN 40 MG PO TABS Oral Take 40 mg by  mouth at bedtime.     Marland Kitchen METOPROLOL TARTRATE 50 MG PO TABS Oral Take 50 mg by mouth 2 (two) times daily.     Marland Kitchen NIFEDIPINE ER OSMOTIC 60 MG PO TB24 Oral Take 60 mg by mouth every morning.     Marland Kitchen PANTOPRAZOLE SODIUM 40 MG PO TBEC Oral Take 40 mg by mouth every morning.     Marland Kitchen TAMSULOSIN HCL 0.4 MG PO CAPS Oral Take 0.4 mg by mouth daily after supper.     . VENLAFAXINE HCL ER 150 MG PO CP24 Oral Take 150 mg by mouth every morning.       BP 146/38  Pulse 78  Temp 97.6 F (36.4 C) (Oral)  Resp 20  Ht 5\' 9"  (1.753 m)  Wt 170 lb (77.111 kg)  BMI 25.10 kg/m2  SpO2 97%  Vital signs normal   Orthostatic VS are normal.    Physical Exam  Nursing note and vitals reviewed. Constitutional: He is oriented to person, place, and time. He appears well-developed and well-nourished.  Non-toxic appearance. He does not appear ill. No distress.  HENT:  Head: Normocephalic and atraumatic.  Right Ear: External ear normal.  Left Ear: External ear normal.  Nose: Nose normal. No mucosal edema or rhinorrhea.  Mouth/Throat: Mucous membranes are normal. No dental abscesses or uvula swelling.       Tongue dry.   Eyes: Conjunctivae and EOM are normal. Pupils are equal, round, and reactive to light.  Neck: Normal range of motion and full passive range of motion without pain. Neck supple.  Cardiovascular: Normal rate, regular rhythm and normal heart sounds.  Exam reveals no gallop and no friction rub.   No murmur heard. Pulmonary/Chest: Effort normal. No respiratory distress. He has wheezes (rare Scattered, expiratory wheezing.). He has no rhonchi. He has no rales. He exhibits no tenderness and no crepitus.  Abdominal: Soft. Normal appearance and bowel sounds are normal. He exhibits no distension. There is no tenderness. There is no rebound and no guarding.  Musculoskeletal: Normal range of motion. He exhibits no edema and no tenderness.       Moves all extremities well. No pronator drift, no focal motor weakness    Neurological: He is alert and oriented to person, place, and time. He has normal strength. No cranial nerve deficit. Coordination normal.  Skin: Skin is warm, dry and intact. No rash noted. No erythema. There is pallor.  Psychiatric: His speech is normal and behavior is normal. His mood appears not anxious.       Flat affect.    ED Course  Procedures (including critical care time)   Medications  0.9 %  sodium chloride infusion (  Intravenous New Bag/Given 11/21/11 1443)  ondansetron (ZOFRAN) injection 4 mg (4 mg Intravenous Given 11/21/11 1445)  meclizine (ANTIVERT) tablet 25 mg (25 mg Oral Given 11/21/11 1519)   Recheck 16:00 sleeping and snoring, when awakened states he feels much better, wants to try drinking fluids  Recheck at discharge drank and feels fine. Feels ready to be discharged.    DIAGNOSTIC STUDIES: Oxygen Saturation is 97% on room air, normal by my interpretation.    COORDINATION OF CARE:    2:33PM- EDP at bedside discusses treatment plan.   Results for orders placed during the hospital encounter of 11/21/11  CBC WITH DIFFERENTIAL      Component Value Range   WBC 7.8  4.0 - 10.5 K/uL   RBC 4.35  4.22 - 5.81 MIL/uL   Hemoglobin 13.1  13.0 - 17.0 g/dL   HCT 95.2  84.1 - 32.4 %   MCV 91.3  78.0 - 100.0 fL   MCH 30.1  26.0 - 34.0 pg   MCHC 33.0  30.0 - 36.0 g/dL   RDW 40.1  02.7 - 25.3 %   Platelets 147 (*) 150 - 400 K/uL   Neutrophils Relative 74  43 - 77 %   Neutro Abs 5.8  1.7 - 7.7 K/uL   Lymphocytes Relative 14  12 - 46 %   Lymphs Abs 1.1  0.7 - 4.0 K/uL   Monocytes Relative 9  3 - 12 %   Monocytes Absolute 0.7  0.1 - 1.0 K/uL   Eosinophils Relative 2  0 - 5 %   Eosinophils Absolute 0.2  0.0 - 0.7 K/uL   Basophils Relative 0  0 - 1 %   Basophils Absolute 0.0  0.0 - 0.1 K/uL  COMPREHENSIVE METABOLIC PANEL      Component Value Range   Sodium 134 (*) 135 - 145 mEq/L   Potassium 4.2  3.5 - 5.1 mEq/L   Chloride 97  96 - 112 mEq/L   CO2 28  19 - 32  mEq/L   Glucose, Bld 151 (*) 70 - 99 mg/dL   BUN 35 (*) 6 - 23 mg/dL   Creatinine, Ser 6.64 (*) 0.50 - 1.35 mg/dL   Calcium 40.3  8.4 - 47.4 mg/dL   Total Protein 6.6  6.0 - 8.3 g/dL   Albumin 3.6  3.5 - 5.2 g/dL   AST 20  0 - 37 U/L   ALT 10  0 - 53 U/L   Alkaline Phosphatase 77  39 - 117 U/L   Total Bilirubin 0.2 (*) 0.3 - 1.2 mg/dL   GFR calc non Af Amer 34 (*) >90 mL/min   GFR calc Af Amer 39 (*) >90 mL/min  TROPONIN I      Component Value Range   Troponin I <0.30  <0.30 ng/mL  URINALYSIS, WITH MICROSCOPIC      Component Value Range   Color, Urine YELLOW  YELLOW   APPearance CLEAR  CLEAR   Specific Gravity, Urine 1.010  1.005 - 1.030   pH 6.0  5.0 - 8.0   Glucose, UA NEGATIVE  NEGATIVE mg/dL   Hgb urine dipstick NEGATIVE  NEGATIVE   Bilirubin Urine NEGATIVE  NEGATIVE   Ketones, ur NEGATIVE  NEGATIVE mg/dL   Protein, ur NEGATIVE  NEGATIVE mg/dL   Urobilinogen, UA 0.2  0.0 - 1.0 mg/dL   Nitrite NEGATIVE  NEGATIVE   Leukocytes, UA NEGATIVE  NEGATIVE      Laboratory interpretation all normal except mild hyponatremia, stable  renal insuffic      Date: 11/21/2011  Rate: 78  Rhythm: normal sinus rhythm  QRS Axis: normal  Intervals: normal  ST/T Wave abnormalities: normal  Conduction Disutrbances:none  Narrative Interpretation: Qwave V2  Old EKG Reviewed: none available    1. Vertigo     New Prescriptions   MECLIZINE (ANTIVERT) 25 MG TABLET    Take 1 po 3-4 times a day prn dizziness   ONDANSETRON (ZOFRAN) 4 MG TABLET    Take 1 tablet (4 mg total) by mouth every 6 (six) hours.    Plan discharge  Devoria Albe, MD, FACEP   MDM   I personally performed the services described in this documentation, which was scribed in my presence. The recorded information has been reviewed and considered.  Devoria Albe, MD, [facility]   Ward Givens, MD 11/21/11 [phone]

## 2011-11-29 ENCOUNTER — Encounter: Payer: Self-pay | Admitting: Cardiovascular Disease

## 2011-11-29 NOTE — Assessment & Plan Note (Signed)
The patient has significant claudication in the right calf with extensive vascular history. He is known to have occluded right SFA stent. He is also status post aortobifem bypass and left femoropopliteal bypass. I will request an aorto iliac duplex ultrasound as well as lower extremity arterial duplex ultrasound. Most likely he will be managed medically. He does have chronic kidney disease with a creatinine around 2 which limits the ability to perform angiography safely. I discussed with him the cortex of smoking cessation in order to avoid future risk of amputation.

## 2011-11-29 NOTE — Assessment & Plan Note (Signed)
Patient had a recent carotid duplex ultrasound which suggested more than 70% stenosis in the left ICA. This appears to be asymptomatic. He had angiography performed last year which according to the report showed a stenosis of less than 50% severity. Due to his extensive medical problems and the fact that this is asymptomatic, I favor medical therapy with close followup every 6-12 months with carotid duplex ultrasound. There is no indication for carotid stenting given that he is asymptomatic. Continue treatment with Plavix and aggressive risk factor modification.

## 2011-11-29 NOTE — Progress Notes (Signed)
HPI  Ricardo Schaefer is a 78 y.o.male who was referred by Dr. Diona Browner for evaluation and management of peripheral arterial disease. The patient has extensive medical problems and use to get his care in [place]. He moved to [place] in 2012.  He has known history of coronary artery disease status post stenting to the LAD and left circumflex with chronically occluded RCA. He does have chronic kidney disease with a creatinine of 1.9. He has elevated PSA and known history of non-Hodgkin's lymphoma.  Showed a previous stroke in 1999 which was thought to be due to left atrial appendage thrombus. He was treated with warfarin at that time. He is known to have carotid artery disease as well but his stroke was not felt to be due to that. His previous vascular surgeon was Dr. Neale Burly at the [facility] [place] in [place]. The patient had previous aortobifem surgery in 2005 as well as left femoropopliteal bypass. Otherwise right side he had stenting done to the right SFA. Most recent angiography was performed in 2012 which according to the report showed an occluded stent in the right SFA with collaterals. He was told that nothing could be done about that. He also had carotid angiography performed at that time. Left ICA stenosis was felt to be less than 50%. He had recent ABI performed which was moderately to severely reduced on the right side at 0.41 and only mildly decreased on the left side at 0.89. He also had carotid duplex ultrasound performed which suggested more than 70% stenosis in the left ICA and moderate stenosis on the right side. The patient does complain of claudication involving the right calf with minimal walking. There is no rest pain or nonhealing ulcers. He continues to smoke half a pack per day.  No Known Allergies   Current Outpatient Prescriptions on File Prior to Visit  Medication Sig Dispense Refill  . clopidogrel (PLAVIX) 75 MG tablet Take 75 mg by mouth every morning.       Marland Kitchen  doxazosin (CARDURA) 4 MG tablet Take 4 mg by mouth every morning.       . hydrALAZINE (APRESOLINE) 25 MG tablet Take 1 tablet by mouth Twice daily. **Takes in the morning and at noon**      . hydrochlorothiazide (MICROZIDE) 12.5 MG capsule Take 12.5 mg by mouth every morning.       Marland Kitchen HYDROcodone-acetaminophen (LORTAB 5) 5-500 MG per tablet Take 1 tablet by mouth every 6 (six) hours as needed. For pain      . lovastatin (MEVACOR) 40 MG tablet Take 40 mg by mouth at bedtime.       . metoprolol (LOPRESSOR) 50 MG tablet Take 50 mg by mouth 2 (two) times daily.       Marland Kitchen NIFEdipine (PROCARDIA XL/ADALAT-CC) 60 MG 24 hr tablet Take 60 mg by mouth every morning.       . pantoprazole (PROTONIX) 40 MG tablet Take 40 mg by mouth every morning.       . Tamsulosin HCl (FLOMAX) 0.4 MG CAPS Take 0.4 mg by mouth daily after supper.       . venlafaxine (EFFEXOR-XR) 150 MG 24 hr capsule Take 150 mg by mouth every morning.          Past Medical History  Diagnosis Date  . Coronary atherosclerosis of native coronary artery     Occluded RCA with collaterals, DES to LAD and circumflex 2005  . Essential hypertension, benign   . Non Hodgkin's lymphoma   .  Chronic renal insufficiency   . Hyperlipidemia   . Degenerative disc disease, lumbar   . Duodenal ulcer     EGD 6/11 - iron deficiency anemia  . Pericardial effusion     Status post window  . Carotid artery disease     Nonobstructive, less than 50% LICA by angiography 5/12  . Stroke     Occurred 1999, had left atrial appendage thrombus at that time - previously on Coumadin  . Peripheral arterial disease   . Enlarged prostate   . Lymphoma   . Melanoma      Past Surgical History  Procedure Date  . Cholecystectomy   . Pericardial window   . Aortobifemoral bypass grafting 2005  . Left femoral popliteal bypass grafting 2005  . Coronary angioplasty with stent placement      Family History  Problem Relation Age of Onset  . COPD Mother   . Coronary  artery disease Mother      History   Social History  . Marital Status: Married    Spouse Name: N/A    Number of Children: N/A  . Years of Education: N/A   Occupational History  . Retired    Social History Main Topics  . Smoking status: Current Everyday Smoker -- 0.5 packs/day    Types: Cigarettes  . Smokeless tobacco: Never Used  . Alcohol Use: No  . Drug Use: No  . Sexually Active: Not on file   Other Topics Concern  . Not on file   Social History Narrative  . No narrative on file       PHYSICAL EXAM   BP 134/48  Pulse 80  Ht 5\' 9"  (1.753 m)  Wt 169 lb (76.658 kg)  BMI 24.96 kg/m2  Constitutional: He is oriented to person, place, and time. He appears well-developed and well-nourished. No distress.  HENT: No nasal discharge.  Head: Normocephalic and atraumatic.  Eyes: Pupils are equal and round. Right eye exhibits no discharge. Left eye exhibits no discharge.  Neck: Normal range of motion. Neck supple. No JVD present. No thyromegaly present.  Cardiovascular: Normal rate, regular rhythm, normal heart sounds and. Exam reveals no gallop and no friction rub. There is a 2/6 systolic ejection murmur at the aortic area Pulmonary/Chest: Effort normal and breath sounds normal. No stridor. No respiratory distress. He has no wheezes. He has no rales. He exhibits no tenderness.  Abdominal: Soft. Bowel sounds are normal. He exhibits no distension. There is no tenderness. There is no rebound and no guarding. There is a surgical scar in the mid abdomen. Musculoskeletal: Normal range of motion. He exhibits no edema and no tenderness.  Neurological: He is alert and oriented to person, place, and time. Coordination normal.  Skin: Skin is warm and dry. No rash noted. He is not diaphoretic. No erythema. No pallor.  Psychiatric: He has a normal mood and affect. His behavior is normal. Judgment and thought content normal.  Vascular: He has surgical scars in both groins and also left  lower extremity likely from previous femoropopliteal bypass. Femoral pulses are diminished on the right side at 0.1 and normal and the left side. PT/DP IS not palpable on the right side and +1 on the left side.      ASSESSMENT AND PLAN

## 2012-01-16 ENCOUNTER — Emergency Department (HOSPITAL_COMMUNITY): Payer: Medicare HMO

## 2012-01-16 ENCOUNTER — Observation Stay (HOSPITAL_COMMUNITY)
Admission: EM | Admit: 2012-01-16 | Discharge: 2012-01-18 | Disposition: A | Discharge status: Home / Self Care | Payer: Medicare HMO | Attending: Internal Medicine | Admitting: Internal Medicine

## 2012-01-16 ENCOUNTER — Encounter (HOSPITAL_COMMUNITY): Payer: Self-pay

## 2012-01-16 DIAGNOSIS — C8589 Other specified types of non-Hodgkin lymphoma, extranodal and solid organ sites: Secondary | ICD-10-CM | POA: Insufficient documentation

## 2012-01-16 DIAGNOSIS — I739 Peripheral vascular disease, unspecified: Secondary | ICD-10-CM | POA: Diagnosis present

## 2012-01-16 DIAGNOSIS — N183 Chronic kidney disease, stage 3 unspecified: Secondary | ICD-10-CM

## 2012-01-16 DIAGNOSIS — R079 Chest pain, unspecified: Principal | ICD-10-CM | POA: Diagnosis present

## 2012-01-16 DIAGNOSIS — I779 Disorder of arteries and arterioles, unspecified: Secondary | ICD-10-CM | POA: Diagnosis present

## 2012-01-16 DIAGNOSIS — I2510 Atherosclerotic heart disease of native coronary artery without angina pectoris: Secondary | ICD-10-CM | POA: Diagnosis present

## 2012-01-16 DIAGNOSIS — I10 Essential (primary) hypertension: Secondary | ICD-10-CM | POA: Diagnosis present

## 2012-01-16 DIAGNOSIS — N289 Disorder of kidney and ureter, unspecified: Secondary | ICD-10-CM | POA: Insufficient documentation

## 2012-01-16 DIAGNOSIS — E782 Mixed hyperlipidemia: Secondary | ICD-10-CM

## 2012-01-16 LAB — COMPREHENSIVE METABOLIC PANEL
BUN: 29 mg/dL — ABNORMAL HIGH (ref 6–23)
CO2: 27 mEq/L (ref 19–32)
Calcium: 10.5 mg/dL (ref 8.4–10.5)
Creatinine, Ser: 1.74 mg/dL — ABNORMAL HIGH (ref 0.50–1.35)
GFR calc Af Amer: 40 mL/min — ABNORMAL LOW (ref >90)
GFR calc non Af Amer: 35 mL/min — ABNORMAL LOW (ref >90)
Glucose, Bld: 167 mg/dL — ABNORMAL HIGH (ref 70–99)

## 2012-01-16 LAB — CBC WITH DIFFERENTIAL/PLATELET
Eosinophils Relative: 3 % (ref 0–5)
HCT: 41.3 % (ref 39.0–52.0)
Lymphocytes Relative: 17 % (ref 12–46)
Lymphs Abs: 1.3 10*3/uL (ref 0.7–4.0)
MCV: 91 fL (ref 78.0–100.0)
Monocytes Absolute: 0.6 10*3/uL (ref 0.1–1.0)
RBC: 4.54 MIL/uL (ref 4.22–5.81)
RDW: 13.4 % (ref 11.5–15.5)
WBC: 7.6 10*3/uL (ref 4.0–10.5)

## 2012-01-16 LAB — CARDIAC PANEL(CRET KIN+CKTOT+MB+TROPI)
Relative Index: INVALID (ref 0.0–2.5)
Total CK: 24 U/L (ref 7–232)

## 2012-01-16 LAB — APTT: aPTT: 38 seconds — ABNORMAL HIGH (ref 24–37)

## 2012-01-16 LAB — PROTIME-INR: Prothrombin Time: 12.7 seconds (ref 11.6–15.2)

## 2012-01-16 MED ORDER — ONDANSETRON HCL 4 MG PO TABS: 4.0000 mg | ORAL_TABLET | Freq: Four times a day (QID) | ORAL | Status: DC | PRN

## 2012-01-16 MED ORDER — NITROGLYCERIN 2 % TD OINT
1.0000 [in_us] | TOPICAL_OINTMENT | Freq: Once | TRANSDERMAL | Status: AC
  Administered 2012-01-16: 1 [in_us] via TOPICAL
  Filled 2012-01-16: qty 1

## 2012-01-16 MED ORDER — ALPRAZOLAM 1 MG PO TABS
1.0000 mg | ORAL_TABLET | Freq: Three times a day (TID) | ORAL | Status: DC | PRN
  Administered 2012-01-16 – 2012-01-18 (×5): 1 mg via ORAL
  Filled 2012-01-16 (×5): qty 1

## 2012-01-16 MED ORDER — PANTOPRAZOLE SODIUM 40 MG PO TBEC
40.0000 mg | DELAYED_RELEASE_TABLET | ORAL | Status: DC
  Administered 2012-01-17: 40 mg via ORAL
  Filled 2012-01-16: qty 1

## 2012-01-16 MED ORDER — TAMSULOSIN HCL 0.4 MG PO CAPS
0.4000 mg | ORAL_CAPSULE | ORAL | Status: DC
  Administered 2012-01-16 – 2012-01-17 (×2): 0.4 mg via ORAL
  Filled 2012-01-16 (×2): qty 1

## 2012-01-16 MED ORDER — SIMVASTATIN 20 MG PO TABS
20.0000 mg | ORAL_TABLET | Freq: Every day | ORAL | Status: DC
  Administered 2012-01-16 – 2012-01-17 (×2): 20 mg via ORAL
  Filled 2012-01-16 (×2): qty 1

## 2012-01-16 MED ORDER — CLOPIDOGREL BISULFATE 75 MG PO TABS
75.0000 mg | ORAL_TABLET | ORAL | Status: DC
  Administered 2012-01-17 – 2012-01-18 (×2): 75 mg via ORAL
  Filled 2012-01-16 (×2): qty 1

## 2012-01-16 MED ORDER — HYDRALAZINE HCL 25 MG PO TABS
25.0000 mg | ORAL_TABLET | Freq: Two times a day (BID) | ORAL | Status: DC
  Administered 2012-01-16 – 2012-01-18 (×4): 25 mg via ORAL
  Filled 2012-01-16 (×4): qty 1

## 2012-01-16 MED ORDER — NIFEDIPINE ER OSMOTIC RELEASE 30 MG PO TB24
60.0000 mg | ORAL_TABLET | ORAL | Status: DC
  Administered 2012-01-17 – 2012-01-18 (×2): 60 mg via ORAL
  Filled 2012-01-16 (×2): qty 2

## 2012-01-16 MED ORDER — HYDROCHLOROTHIAZIDE 12.5 MG PO CAPS
12.5000 mg | ORAL_CAPSULE | Freq: Every day | ORAL | Status: DC
  Administered 2012-01-17 – 2012-01-18 (×2): 12.5 mg via ORAL
  Filled 2012-01-16 (×2): qty 1

## 2012-01-16 MED ORDER — VENLAFAXINE HCL ER 75 MG PO CP24
150.0000 mg | ORAL_CAPSULE | Freq: Every day | ORAL | Status: DC
  Administered 2012-01-17 – 2012-01-18 (×2): 150 mg via ORAL
  Filled 2012-01-16 (×2): qty 2

## 2012-01-16 MED ORDER — DOXAZOSIN MESYLATE 2 MG PO TABS
4.0000 mg | ORAL_TABLET | Freq: Every day | ORAL | Status: DC
  Administered 2012-01-17 – 2012-01-18 (×2): 4 mg via ORAL
  Filled 2012-01-16 (×2): qty 2

## 2012-01-16 MED ORDER — METOPROLOL TARTRATE 50 MG PO TABS
50.0000 mg | ORAL_TABLET | Freq: Two times a day (BID) | ORAL | Status: DC
Start: 2012-01-16 — End: 2012-01-18
  Administered 2012-01-16 – 2012-01-18 (×4): 50 mg via ORAL
  Filled 2012-01-16 (×4): qty 1

## 2012-01-16 MED ORDER — HYDROCODONE-ACETAMINOPHEN 5-325 MG PO TABS: 1.0000 | ORAL_TABLET | ORAL | Status: DC | PRN

## 2012-01-16 MED ORDER — HEPARIN SODIUM (PORCINE) 5000 UNIT/ML IJ SOLN
5000.0000 [IU] | Freq: Three times a day (TID) | INTRAMUSCULAR | Status: DC
  Administered 2012-01-16 – 2012-01-18 (×5): 5000 [IU] via SUBCUTANEOUS
  Filled 2012-01-16 (×5): qty 1

## 2012-01-16 MED ORDER — PNEUMOCOCCAL VAC POLYVALENT 25 MCG/0.5ML IJ INJ
0.5000 mL | INJECTION | INTRAMUSCULAR | Status: AC
  Filled 2012-01-16: qty 0.5

## 2012-01-16 MED ORDER — ONDANSETRON HCL 4 MG/2ML IJ SOLN: 4.0000 mg | Freq: Four times a day (QID) | INTRAMUSCULAR | Status: DC | PRN

## 2012-01-16 MED ORDER — SODIUM CHLORIDE 0.9 % IJ SOLN
3.0000 mL | Freq: Two times a day (BID) | INTRAMUSCULAR | Status: DC
  Administered 2012-01-16 – 2012-01-17 (×3): 3 mL via INTRAVENOUS
  Filled 2012-01-16 (×4): qty 3

## 2012-01-16 NOTE — ED Provider Notes (Signed)
History     CSN: [id number]  Arrival date & time 01/16/12  1309   First MD Initiated Contact with Patient 01/16/12 1331      Chief Complaint  Patient presents with  . Chest Pain    (Consider location/radiation/quality/duration/timing/severity/associated sxs/prior treatment) HPI  Patient reports 3 days ago he started having chest pain that he's never had before. He indicates the pain is in the center of his chest and will shoot over to his left chest. He states the pain lasted a second and at its longest lasted about 10 seconds. The pain is sharp. He states it does make him feel short of breath. He denies nausea, vomiting, diaphoresis. The pain does not radiate into his arms or into his neck. Nothing he does makes it feel worse, nothing he does makes it feel better. His wife reports he has 2 cardiac stents that were placed in 2004 while he was living in [place]. He reports he did not have any symptoms with those blockages, and they were found during a routine vascular evaluation. Patient has multiple other vascular issues.  PCP Dr Tanya Nones Cardiologist Dr Diona Browner  Past Medical History  Diagnosis Date  . Coronary atherosclerosis of native coronary artery     Occluded RCA with collaterals, DES to LAD and circumflex 2005  . Essential hypertension, benign   . Non Hodgkin's lymphoma   . Chronic renal insufficiency   . Hyperlipidemia   . Degenerative disc disease, lumbar   . Duodenal ulcer     EGD 6/11 - iron deficiency anemia  . Pericardial effusion     Status post window  . Carotid artery disease     Nonobstructive, less than 50% LICA by angiography 5/12  . Stroke     Occurred 1999, had left atrial appendage thrombus at that time - previously on Coumadin  . Peripheral arterial disease   . Enlarged prostate   . Lymphoma   . Melanoma     Past Surgical History  Procedure Date  . Cholecystectomy   . Pericardial window   . Aortobifemoral bypass grafting 2005  . Left femoral  popliteal bypass grafting 2005  . Coronary angioplasty with stent placement     Family History  Problem Relation Age of Onset  . COPD Mother   . Coronary artery disease Mother     History  Substance Use Topics  . Smoking status: Current Everyday Smoker -- 0.5 packs/day    Types: Cigarettes  . Smokeless tobacco: Never Used  . Alcohol Use: No   Lives at home Lives with spouse   Review of Systems  All other systems reviewed and are negative.    Allergies  Aspirin  Home Medications   Current Outpatient Rx  Name Route Sig Dispense Refill  . ALPRAZOLAM 1 MG PO TABS Oral Take 1 tablet by mouth Three times daily as needed. Panic attacks    . CHLORPHENIRAMINE MALEATE 4 MG PO TABS Oral Take 4 mg by mouth daily as needed. For sinus/allergies    . CLOPIDOGREL BISULFATE 75 MG PO TABS Oral Take 75 mg by mouth every morning.     Marland Kitchen DOXAZOSIN MESYLATE 4 MG PO TABS Oral Take 4 mg by mouth every morning.     Marland Kitchen HYDRALAZINE HCL 25 MG PO TABS Oral Take 1 tablet by mouth Twice daily.     Marland Kitchen HYDROCHLOROTHIAZIDE 12.5 MG PO CAPS Oral Take 12.5 mg by mouth every morning.     Marland Kitchen HYDROCODONE-ACETAMINOPHEN 5-500 MG PO TABS Oral  Take 1 tablet by mouth every 6 (six) hours as needed. For pain    . BOOST PO LIQD Oral Take 1 Container by mouth daily.    Marland Kitchen LOVASTATIN 40 MG PO TABS Oral Take 40 mg by mouth at bedtime.     Marland Kitchen METOPROLOL TARTRATE 50 MG PO TABS Oral Take 50 mg by mouth 2 (two) times daily.     Marland Kitchen NIFEDIPINE ER OSMOTIC 60 MG PO TB24 Oral Take 60 mg by mouth every morning.     Marland Kitchen PANTOPRAZOLE SODIUM 40 MG PO TBEC Oral Take 40 mg by mouth every morning.     Marland Kitchen TAMSULOSIN HCL 0.4 MG PO CAPS Oral Take 0.4 mg by mouth daily after supper.     . VENLAFAXINE HCL ER 150 MG PO CP24 Oral Take 150 mg by mouth every morning.       BP 142/46  Temp 97.6 F (36.4 C) (Oral)  Resp 18  Ht 5\' 8"  (1.727 m)  Wt 166 lb (75.297 kg)  BMI 25.24 kg/m2  SpO2 94%  Vital signs normal    Physical Exam    Constitutional: He is oriented to person, place, and time. He appears well-developed and well-nourished.  Non-toxic appearance. He does not appear ill. No distress.  HENT:  Head: Normocephalic and atraumatic.  Right Ear: External ear normal.  Left Ear: External ear normal.  Nose: Nose normal. No mucosal edema or rhinorrhea.  Mouth/Throat: Oropharynx is clear and moist and mucous membranes are normal. No dental abscesses or uvula swelling.  Eyes: Conjunctivae and EOM are normal. Pupils are equal, round, and reactive to light.  Neck: Normal range of motion and full passive range of motion without pain. Neck supple.  Cardiovascular: Normal rate, regular rhythm and normal heart sounds.  Exam reveals no gallop and no friction rub.   No murmur heard. Pulmonary/Chest: Effort normal and breath sounds normal. No respiratory distress. He has no wheezes. He has no rhonchi. He has no rales. He exhibits no tenderness and no crepitus.  Abdominal: Soft. Normal appearance and bowel sounds are normal. He exhibits no distension. There is no tenderness. There is no rebound and no guarding.  Musculoskeletal: Normal range of motion. He exhibits no edema and no tenderness.       Moves all extremities well.   Neurological: He is alert and oriented to person, place, and time. He has normal strength. No cranial nerve deficit.  Skin: Skin is warm, dry and intact. No rash noted. No erythema. No pallor.  Psychiatric: He has a normal mood and affect. His speech is normal and behavior is normal. His mood appears not anxious.    ED Course  Procedures (including critical care time)   Medications  nitroGLYCERIN (NITROGLYN) 2 % ointment 1 inch (1 inch Topical Given 01/16/12 1428)   Pt not given ASA, he states it causes gastric ulcers and he won't take even one dose  When I sat patient up to listen to his lungs he developed chest pain again. It appearred to last about 1 minute. A repeat EKG was done.   15:41 Dr Karilyn Cota  admit to obs, tele, he will come see patient.   Results for orders placed during the hospital encounter of 01/16/12  CBC WITH DIFFERENTIAL      Component Value Range   WBC 7.6  4.0 - 10.5 K/uL   RBC 4.54  4.22 - 5.81 MIL/uL   Hemoglobin 13.8  13.0 - 17.0 g/dL   HCT 21.3  08.6 -  52.0 %   MCV 91.0  78.0 - 100.0 fL   MCH 30.4  26.0 - 34.0 pg   MCHC 33.4  30.0 - 36.0 g/dL   RDW 16.1  09.6 - 04.5 %   Platelets 156  150 - 400 K/uL   Neutrophils Relative 72  43 - 77 %   Neutro Abs 5.5  1.7 - 7.7 K/uL   Lymphocytes Relative 17  12 - 46 %   Lymphs Abs 1.3  0.7 - 4.0 K/uL   Monocytes Relative 8  3 - 12 %   Monocytes Absolute 0.6  0.1 - 1.0 K/uL   Eosinophils Relative 3  0 - 5 %   Eosinophils Absolute 0.2  0.0 - 0.7 K/uL   Basophils Relative 1  0 - 1 %   Basophils Absolute 0.0  0.0 - 0.1 K/uL  COMPREHENSIVE METABOLIC PANEL      Component Value Range   Sodium 136  135 - 145 mEq/L   Potassium 4.3  3.5 - 5.1 mEq/L   Chloride 100  96 - 112 mEq/L   CO2 27  19 - 32 mEq/L   Glucose, Bld 167 (*) 70 - 99 mg/dL   BUN 29 (*) 6 - 23 mg/dL   Creatinine, Ser 4.09 (*) 0.50 - 1.35 mg/dL   Calcium 81.1  8.4 - 91.4 mg/dL   Total Protein 6.5  6.0 - 8.3 g/dL   Albumin 3.6  3.5 - 5.2 g/dL   AST 21  0 - 37 U/L   ALT 10  0 - 53 U/L   Alkaline Phosphatase 81  39 - 117 U/L   Total Bilirubin 0.3  0.3 - 1.2 mg/dL   GFR calc non Af Amer 35 (*) >90 mL/min   GFR calc Af Amer 40 (*) >90 mL/min  TROPONIN I      Component Value Range   Troponin I <0.30  <0.30 ng/mL  APTT      Component Value Range   aPTT 38 (*) 24 - 37 seconds  PROTIME-INR      Component Value Range   Prothrombin Time 12.7  11.6 - 15.2 seconds   INR 0.93  0.00 - 1.49   Laboratory interpretation all normal except baseline renal insufficiency, hyperglycemia .    Dg Chest Portable 1 View  01/16/2012  *RADIOLOGY REPORT*  Clinical Data: Chest pain  PORTABLE CHEST - 1 VIEW  Comparison: None.  Findings: 1517 hours. The lungs are clear  without focal infiltrate, edema, pneumothorax or pleural effusion. The cardiopericardial silhouette is enlarged. Imaged bony structures of the thorax are intact. Telemetry leads overlie the chest.  IMPRESSION: No acute cardiopulmonary findings.   Original Report Authenticated By: ERIC A. MANSELL, M.D.      #1   Date: 01/16/2012  Rate: 74  Rhythm: normal sinus rhythm  QRS Axis: normal  Intervals: normal  ST/T Wave abnormalities: normal  Conduction Disutrbances:none  Narrative Interpretation: Q waves septally  Old EKG Reviewed: unchanged from 11/21/2011  #2  Date: 01/16/2012  Rate: 72  Rhythm: normal sinus rhythm  QRS Axis: normal  Intervals: normal  ST/T Wave abnormalities: normal  Conduction Disutrbances:none  Narrative Interpretation: Q waves septally  Old EKG Reviewed: unchanged from earlier today      1. Chest pain at rest   2. Renal insufficiency    Plan admission  Devoria Albe, MD, FACEP    MDM          Ward Givens, MD 01/16/12 [phone]

## 2012-01-16 NOTE — H&P (Signed)
[facility] History and Physical  Ricardo Schaefer ZOX:[id number] DOB: [birth year] DOA: 01/16/2012  Referring physician: Dr. Lynelle Doctor, ER physician. PCP: Leo Grosser, MD   Chief Complaint: Left-sided chest pain.  HPI: Ricardo Schaefer is a 78 y.o. male who presents with the above symptoms since the last 3 days. The pains are intermittent and usually last less than 1 minute. He describes them as sharp pains associated with dyspnea. There are no left-sided the chest radiating to the left shoulder. He denies any sweating, nausea or palpitations with these pains. He does have a history of coronary artery disease. He sees [facility]. He is now being referred for remission.  Review of Systems: Apart from history of present illness, other systems negative.  Past Medical History  Diagnosis Date  . Coronary atherosclerosis of native coronary artery     Occluded RCA with collaterals, DES to LAD and circumflex 2005  . Essential hypertension, benign   . Non Hodgkin's lymphoma   . Chronic renal insufficiency   . Hyperlipidemia   . Degenerative disc disease, lumbar   . Duodenal ulcer     EGD 6/11 - iron deficiency anemia  . Pericardial effusion     Status post window  . Carotid artery disease     Nonobstructive, less than 50% LICA by angiography 5/12  . Stroke     Occurred 1999, had left atrial appendage thrombus at that time - previously on Coumadin  . Peripheral arterial disease   . Enlarged prostate   . Lymphoma   . Melanoma    Past Surgical History  Procedure Date  . Cholecystectomy   . Pericardial window   . Aortobifemoral bypass grafting 2005  . Left femoral popliteal bypass grafting 2005  . Coronary angioplasty with stent placement    Social History:  He has been married for 34 years and lives with his wife. He continues to smoke cigarettes. He does not drink alcohol. He is retired.   Allergies  Allergen Reactions  . Aspirin     Causes stomach bleeds    Family  History  Problem Relation Age of Onset  . COPD Mother   . Coronary artery disease Mother     Prior to Admission medications   Medication Sig Start Date End Date Taking? Authorizing Provider  ALPRAZolam Prudy Feeler) 1 MG tablet Take 1 tablet by mouth Three times daily as needed. Panic attacks 09/27/11  Yes Historical Provider, MD  chlorpheniramine (CHLOR-TRIMETON) 4 MG tablet Take 4 mg by mouth daily as needed. For sinus/allergies   Yes Historical Provider, MD  clopidogrel (PLAVIX) 75 MG tablet Take 75 mg by mouth every morning.  04/04/11  Yes Historical Provider, MD  doxazosin (CARDURA) 4 MG tablet Take 4 mg by mouth every morning.    Yes Historical Provider, MD  hydrALAZINE (APRESOLINE) 25 MG tablet Take 1 tablet by mouth Twice daily.  10/28/11  Yes Historical Provider, MD  hydrochlorothiazide (MICROZIDE) 12.5 MG capsule Take 12.5 mg by mouth every morning.    Yes Historical Provider, MD  HYDROcodone-acetaminophen (LORTAB 5) 5-500 MG per tablet Take 1 tablet by mouth every 6 (six) hours as needed. For pain   Yes Historical Provider, MD  lactose free nutrition (BOOST) LIQD Take 1 Container by mouth daily.   Yes Historical Provider, MD  lovastatin (MEVACOR) 40 MG tablet Take 40 mg by mouth at bedtime.  04/04/11  Yes Historical Provider, MD  metoprolol (LOPRESSOR) 50 MG tablet Take 50 mg by mouth 2 (two) times daily.  03/21/11  Yes Historical Provider, MD  NIFEdipine (PROCARDIA XL/ADALAT-CC) 60 MG 24 hr tablet Take 60 mg by mouth every morning.  04/06/11  Yes Historical Provider, MD  pantoprazole (PROTONIX) 40 MG tablet Take 40 mg by mouth every morning.  03/31/11  Yes Historical Provider, MD  Tamsulosin HCl (FLOMAX) 0.4 MG CAPS Take 0.4 mg by mouth daily after supper.  04/17/11  Yes Historical Provider, MD  venlafaxine (EFFEXOR-XR) 150 MG 24 hr capsule Take 150 mg by mouth every morning.  03/21/11  Yes Historical Provider, MD   Physical Exam: Filed Vitals:   01/16/12 1315 01/16/12 1506 01/16/12 1559  01/16/12 1608  BP: 142/46 154/48 146/54 134/54  Pulse:    67  Temp: 97.6 F (36.4 C)     TempSrc: Oral     Resp: 18 21 17 20   Height: 5\' 8"  (1.727 m)     Weight: 75.297 kg (166 lb)     SpO2: 94% 97% 97% 97%     General:  He looks systemically well and is not toxic or septic. He does not appear to be in any pain.  Eyes: No jaundice.  ENT: No abnormalities.  Neck: No neck lymphadenopathy.  Cardiovascular: Heart sounds are present and normal without murmurs. There is no pericardial rub. Jugular venous pressure not elevated.  Respiratory: Lung fields are entirely clear. There is no pleural rub.  Abdomen: Soft, nontender, no hepatosplenomegaly.  Skin: There does not appear to be a rash, in particular there is no rash in the left chest.  Musculoskeletal: No tenderness in the anterior chest wall.  Psychiatric: Appropriate affect.  Neurologic: Alert and orientated without any focal neurological signs.  Labs on Admission:  Basic Metabolic Panel:  Lab 01/16/12 4098  NA 136  K 4.3  CL 100  CO2 27  GLUCOSE 167*  BUN 29*  CREATININE 1.74*  CALCIUM 10.5  MG --  PHOS --   Liver Function Tests:  Lab 01/16/12 1400  AST 21  ALT 10  ALKPHOS 81  BILITOT 0.3  PROT 6.5  ALBUMIN 3.6     CBC:  Lab 01/16/12 1400  WBC 7.6  NEUTROABS 5.5  HGB 13.8  HCT 41.3  MCV 91.0  PLT 156   Cardiac Enzymes:  Lab 01/16/12 1400  CKTOTAL --  CKMB --  CKMBINDEX --  TROPONINI <0.30      Radiological Exams on Admission: Dg Chest Portable 1 View  01/16/2012  *RADIOLOGY REPORT*  Clinical Data: Chest pain  PORTABLE CHEST - 1 VIEW  Comparison: None.  Findings: 1517 hours. The lungs are clear without focal infiltrate, edema, pneumothorax or pleural effusion. The cardiopericardial silhouette is enlarged. Imaged bony structures of the thorax are intact. Telemetry leads overlie the chest.  IMPRESSION: No acute cardiopulmonary findings.   Original Report Authenticated By: ERIC A.  MANSELL, M.D.       Assessment/Plan   1. Left-sided chest pain, unclear etiology, does not appear to be cardiac in nature. Patient does have a history of coronary artery disease. Admit to telemetry. Serial cardiac enzymes. Consider cardiology consultation in the morning. 2. Hypertension. 3. Peripheral arterial disease. 4.  Chronic kidney disease, stable.  Code Status: Full code Family Communication: Plan discussed with the patient at the bedside. Disposition Plan: Home when medically stable.  Time spent: 35 minutes.  Wilson Singer [facility] Pager [phone].  If 7PM-7AM, please contact night-coverage [email or web address] Password [facility] 01/16/2012, 4:12 PM

## 2012-01-16 NOTE — ED Notes (Signed)
Family at bedside. Patient states he does not need anything at this time. 

## 2012-01-16 NOTE — ED Notes (Signed)
Pt c/o chest pain that started Friday the pain comes and goes and feels like a sharp pain moving from center to left chest.

## 2012-01-17 ENCOUNTER — Observation Stay (HOSPITAL_COMMUNITY): Payer: Medicare HMO

## 2012-01-17 DIAGNOSIS — N183 Chronic kidney disease, stage 3 unspecified: Secondary | ICD-10-CM

## 2012-01-17 LAB — COMPREHENSIVE METABOLIC PANEL
AST: 19 U/L (ref 0–37)
Albumin: 3.3 g/dL — ABNORMAL LOW (ref 3.5–5.2)
Alkaline Phosphatase: 75 U/L (ref 39–117)
Chloride: 102 mEq/L (ref 96–112)
Potassium: 4.1 mEq/L (ref 3.5–5.1)
Total Bilirubin: 0.2 mg/dL — ABNORMAL LOW (ref 0.3–1.2)

## 2012-01-17 LAB — CBC
MCH: 30.2 pg (ref 26.0–34.0)
MCV: 90.7 fL (ref 78.0–100.0)
Platelets: 145 10*3/uL — ABNORMAL LOW (ref 150–400)
RDW: 13.4 % (ref 11.5–15.5)

## 2012-01-17 LAB — TSH: TSH: 1.581 u[IU]/mL (ref 0.350–4.500)

## 2012-01-17 LAB — CARDIAC PANEL(CRET KIN+CKTOT+MB+TROPI)
Relative Index: INVALID (ref 0.0–2.5)
Relative Index: INVALID (ref 0.0–2.5)

## 2012-01-17 NOTE — Progress Notes (Signed)
     Subjective: This man was admitted yesterday with intermittent sharp like left-sided chest pains which lasted usually less than a minute or so. Serial cardiac enzymes overnight have been negative. However, he still continues to get these pains. He has not developed any rash.           Physical Exam: Blood pressure 157/60, pulse 68, temperature 98.1 F (36.7 C), temperature source Oral, resp. rate 20, height 5\' 8"  (1.727 m), weight 75.3 kg (166 lb 0.1 oz), SpO2 99.00%. He looks systemically well. He is not in any respiratory distress. Heart sounds are present and normal without any pericardial rub. Lung fields are clear with occasional wheezing. I can still not see any kind of rash on his chest.   Investigations:     Basic Metabolic Panel:  Basename 01/17/12 0533 01/16/12 1400  NA 138 136  K 4.1 4.3  CL 102 100  CO2 28 27  GLUCOSE 91 167*  BUN 31* 29*  CREATININE 1.83* 1.74*  CALCIUM 10.4 10.5  MG -- --  PHOS -- --   Liver Function Tests:  Basename 01/17/12 0533 01/16/12 1400  AST 19 21  ALT 11 10  ALKPHOS 75 81  BILITOT 0.2* 0.3  PROT 6.1 6.5  ALBUMIN 3.3* 3.6     CBC:  Basename 01/17/12 0533 01/16/12 1400  WBC 6.9 7.6  NEUTROABS -- 5.5  HGB 12.7* 13.8  HCT 38.2* 41.3  MCV 90.7 91.0  PLT 145* 156    Dg Chest Portable 1 View  01/16/2012  *RADIOLOGY REPORT*  Clinical Data: Chest pain  PORTABLE CHEST - 1 VIEW  Comparison: None.  Findings: 1517 hours. The lungs are clear without focal infiltrate, edema, pneumothorax or pleural effusion. The cardiopericardial silhouette is enlarged. Imaged bony structures of the thorax are intact. Telemetry leads overlie the chest.  IMPRESSION: No acute cardiopulmonary findings.   Original Report Authenticated By: ERIC A. MANSELL, M.D.       Medications: I have reviewed the patient's current medications.  Impression: 1. Intermittent chest pain in a patient who has a history of coronary artery disease. 2.  Hypertension. 3. Peripheral arterial disease. 4. Carotid artery disease. 5. Chronic kidney disease.     Plan: 1. CT scan of the chest to make sure there is no pulmonary component to this pain. We cannot do a CT angiogram in view of his renal dysfunction. 2. Cardiology consultation. 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 However, I'm not impressed that his chest pain is cardiac in origin.     LOS: 1 day   Wilson Singer Pager [phone]  01/17/2012, 7:54 AM

## 2012-01-17 NOTE — Consult Note (Signed)
CARDIOLOGY CONSULT NOTE  Patient ID: Ricardo Schaefer MRN: [id number] DOB/AGE: March 23, 1927 78 y.o.  Admit date: 01/16/2012 Referring Physician: PTH-Gosroni Primary PhysicianPICKARD,WARREN TOM, MD Primary Cardiologist: Diona Browner Reason for Consultation: Chest pain with known CAD  Principal Problem:  *Chest pain at rest Active Problems:  Coronary atherosclerosis of native coronary artery  Essential hypertension, benign  Carotid artery disease  Peripheral arterial disease  HPI: [name] is an 78 year old patient of Dr. Diona Browner with known history of CAD with a drug-eluting stent to the LAD and left circumflex with chronically occluded right coronary artery per cardiac catheterization 2005, in [place], carotid artery disease, hypertension, hyperlipidemia, and chronic renal insufficiency. He presented to [facility] emergency room with intermittent chest discomfort occurring during the last 72 hours and lasting less than a minute described as sharp with associated shortness of breath. The pain was not exacerbated or relieved by activity.  Discomfort originates just to the right of the lower sternum and radiates across the sternum to the left costal margin.    On arrival to the emergency room the patient's blood pressure was 142/46 with a heart rate of 67 beats per minute. Cardiac enzymes times one was negative, it was not found to be anemic, potassium was 4.3 his creatinine is 1.74 (baseline for him). EKG revealed normal sinus rhythm with evidence of septal infarct unchanged since July of 2013.   He was also recently seen by Dr.Arida secondary to history of PAD with complaints of leg pain and recent ABI revealing moderately to severely reduced on the right with 0.41 indices, and 0.89 on the left. Most recent carotid duplex study recently performed suggested more than 30% stenosis of the left ICA and moderate stenosis of the right side. When seen by Dr. Kirke Corin on 11/09/2011, he was recommended for  medical management as he was asymptomatic with close followup in 6-12 months with another carotid duplex study. Also per Dr. Jari Sportsman notes a aortoiliac duplex ultrasound was ordered, as he wished to avoid angiography with chronic renal insufficiency. The patient unfortunately did not followup and have these tests completed. He states he wishes to follow with Dr. Diona Browner for the necessity of pursuing this. Patient was not in agreement with Dr.Arida's plans.   At the time of this assessment the patient continues to have intermittent complaints of discomfort although this is not been as bad as when he was admitted or occurring as often.  Review of systems complete and found to be negative unless listed above   Past Medical History  Diagnosis Date  . Coronary atherosclerosis of native coronary artery     Occluded RCA with collaterals, DES to LAD and circumflex 2005  . Essential hypertension, benign   . Non Hodgkin's lymphoma   . Chronic renal insufficiency   . Hyperlipidemia   . Degenerative disc disease, lumbar   . Duodenal ulcer     EGD 6/11 - iron deficiency anemia  . Pericardial effusion     Status post window  . Carotid artery disease     Nonobstructive, less than 50% LICA by angiography 5/12  . Stroke     Occurred 1999, had left atrial appendage thrombus at that time - previously on Coumadin  . Peripheral arterial disease   . Enlarged prostate   . Lymphoma   . Melanoma     Family History  Problem Relation Age of Onset  . COPD Mother   . Coronary artery disease Mother     History   Social History  .  Marital Status: Married    Spouse Name: N/A    Number of Children: N/A  . Years of Education: N/A   Occupational History  . Retired    Social History Main Topics  . Smoking status: Current Everyday Smoker -- 0.5 packs/day    Types: Cigarettes  . Smokeless tobacco: Never Used  . Alcohol Use: No  . Drug Use: No  . Sexually Active: Not on file   Other Topics Concern  .  Not on file   Social History Narrative  . No narrative on file    Past Surgical History  Procedure Date  . Cholecystectomy   . Pericardial window   . Aortobifemoral bypass grafting 2005  . Left femoral popliteal bypass grafting 2005  . Coronary angioplasty with stent placement     Prescriptions prior to admission  Medication Sig Dispense Refill  . ALPRAZolam (XANAX) 1 MG tablet Take 1 tablet by mouth Three times daily as needed. Panic attacks      . chlorpheniramine (CHLOR-TRIMETON) 4 MG tablet Take 4 mg by mouth daily as needed. For sinus/allergies      . clopidogrel (PLAVIX) 75 MG tablet Take 75 mg by mouth every morning.       Marland Kitchen doxazosin (CARDURA) 4 MG tablet Take 4 mg by mouth every morning.       . hydrALAZINE (APRESOLINE) 25 MG tablet Take 1 tablet by mouth Twice daily.       . hydrochlorothiazide (MICROZIDE) 12.5 MG capsule Take 12.5 mg by mouth every morning.       Marland Kitchen HYDROcodone-acetaminophen (LORTAB 5) 5-500 MG per tablet Take 1 tablet by mouth every 6 (six) hours as needed. For pain      . lactose free nutrition (BOOST) LIQD Take 1 Container by mouth daily.      Marland Kitchen lovastatin (MEVACOR) 40 MG tablet Take 40 mg by mouth at bedtime.       . metoprolol (LOPRESSOR) 50 MG tablet Take 50 mg by mouth 2 (two) times daily.       Marland Kitchen NIFEdipine (PROCARDIA XL/ADALAT-CC) 60 MG 24 hr tablet Take 60 mg by mouth every morning.       . pantoprazole (PROTONIX) 40 MG tablet Take 40 mg by mouth every morning.       . Tamsulosin HCl (FLOMAX) 0.4 MG CAPS Take 0.4 mg by mouth daily after supper.       . venlafaxine (EFFEXOR-XR) 150 MG 24 hr capsule Take 150 mg by mouth every morning.        Physical Exam: Blood pressure 157/60, pulse 68, temperature 98.1 F (36.7 C), temperature source Oral, resp. rate 20, height 5\' 8"  (1.727 m), weight 166 lb 0.1 oz (75.3 kg), SpO2 99.00%.   General: Well developed, well nourished, in no acute distress, thin, pale. Head: Eyes PERRLA, No xanthomas.   Normal  cephalic and atramatic  Lungs: Clear bilaterally to auscultation and percussion. Heart: HRRR S1 S2, distant heart sounds, without MRG.  Pulses are 2+ & equal.            No carotid bruit. No JVD.  No abdominal bruits. No femoral bruits. Abdomen: Bowel sounds are positive, abdomen soft and non-tender without masses or                  Hernia's noted. Msk:  Back normal, normal gait. Diminished strength and tone for age. Some discomfort with sitting up for lung ausculation. Extremities: No clubbing, cyanosis or edema.  Unable to palpate  distal pulses.  Neuro: Alert and oriented X 3. Hard of hearing. Psych:  Good affect, responds appropriately  Lab Results  Component Value Date   WBC 6.9 01/17/2012   HGB 12.7* 01/17/2012   HCT 38.2* 01/17/2012   MCV 90.7 01/17/2012   PLT 145* 01/17/2012    Lab 01/17/12 0533  NA 138  K 4.1  CL 102  CO2 28  BUN 31*  CREATININE 1.83*  CALCIUM 10.4  PROT 6.1  BILITOT 0.2*  ALKPHOS 75  ALT 11  AST 19  GLUCOSE 91   Lab Results  Component Value Date   CKTOTAL 22 01/17/2012   CKMB 1.6 01/17/2012   TROPONINI <0.30 01/17/2012     Radiology: Ct Chest Wo Contrast 01/17/12: Axillary adenopathy, soft tissue mass in the right upper abdomen, minimal pericardial effusion.  EKG:NSR with possible prior septal infarct. No acute abnormalities.  ASSESSMENT AND PLAN:   1. Chest Pain: Atypical chest discomfort described as sharp substernal radiating to the left side of the chest without associated diaphoresis, dizziness, or dyspnea.This seems to worsen with movement of his torso. It is not reproducible with patient on this exam. Cardiac enzymes are found be negative x3 with an EKG that is unchanged since July 2013. This appears to be more musculoskeletal in origin.   2. PAD: Abnormal ABIs and was sent to see Dr. Kirke Corin, with planned followup testing recommended with aortic iliac duplex ultrasound as well as lower extremity arterial duplex ultrasound. The patient refusd to  have these tests completed as he was unhappy with Dr. Kirke Corin.   3. CAD: He had cardiac catheterization 2005 in [place]. A totallyl occluded RCA with collaterals with DES LAD and circumflex. His EKG is unchanged from July of 2013. Continue Plavix, aspirin, Toprol, nifedipine, HCTZ.    Bettey Mare. Lyman Bishop NP [facility] 01/17/2012, 11:43 AM  Cardiology Attending Patient interviewed and examined. Discussed with Joni Reining, NP.  Above note annotated and modified based upon my findings.  Chest pain is very atypical and likely not of cardiac origin. Lymphadenopathy in right upper quadrant mass seen on CT scan appear the more important issue at present and may account for the patient's discomfort. No further cardiac testing or treatment is necessary at present.  Blunt Bing, MD 01/17/2012, 9:31 PM

## 2012-01-18 DIAGNOSIS — N289 Disorder of kidney and ureter, unspecified: Secondary | ICD-10-CM

## 2012-01-18 NOTE — Progress Notes (Signed)
Subjective:  Occasional sharp shooting chest pain. Ready to go home.  Objective:  Vital Signs in the last 24 hours: Temp:  [97.4 F (36.3 C)-98.5 F (36.9 C)] 98.5 F (36.9 C) (08/28 0531) Pulse Rate:  [62-71] 62  (08/28 0531) Resp:  [20] 20  (08/28 0531) BP: (128-162)/(53-72) 162/53 mmHg (08/28 0531) SpO2:  [92 %-99 %] 99 % (08/28 0531)  Intake/Output from previous day: 08/27 0701 - 08/28 0700 In: 240 [P.O.:240] Out: 275 [Urine:275]  Physical Exam: NECK: Without JVD, HJR, or bruit LUNGS: Decreased breath sounds throughout. HEART: Regular rate and rhythm, 2/6 systolic murmur at the left sternal border, no gallop, rub, bruit, thrill, or heave EXTREMITIES: Without cyanosis, clubbing, or edema  Basename 01/17/12 0533 01/16/12 1400  WBC 6.9 7.6  HGB 12.7* 13.8  PLT 145* 156    Basename 01/17/12 0533 01/16/12 1400  NA 138 136  K 4.1 4.3  CL 102 100  CO2 28 27  GLUCOSE 91 167*  BUN 31* 29*  CREATININE 1.83* 1.74*    Basename 01/17/12 0826 01/17/12 0011  TROPONINI <0.30 <0.30   Hepatic Function Panel  Basename 01/17/12 0533  PROT 6.1  ALBUMIN 3.3*  AST 19  ALT 11  ALKPHOS 75  BILITOT 0.2*  BILIDIR --  IBILI --   Imaging: CT chest: 1.  Axillary and retrocrural adenopathy.  Large soft tissue mass in the right side of the upper abdomen may represent lymphoma. 2.  Tiny pericardial effusion.  Assessment/Plan:  1. Chest Pain: Atypical chest discomfort  Cardiac enzymes are found be negative x3 with an EKG that is unchanged since July 2013. No further cardiac workup at this time. Return office visit to be scheduled with Dr. Diona Browner  2. PAD: Abnormal ABIs with planned followup testing recommended with aortic iliac duplex ultrasound as well as lower extremity arterial duplex ultrasound. The patient refused to have these tests completed.  3. CAD: Cardiac catheterization 2005 in [place]-> TO of RCA with collaterals, DES to LAD and Cx. His EKG is unchanged from July of  2013. Continue Plavix, aspirin, Toprol, nifedipine, HCTZ.    4. Lymphadenopathy on CT; 8/oh of non-Hodgkin's lymphoma. Initial visit to Dr. Jerelyn Scott planned with appointment in one month  5. Renal insufficiency:  Creatinine of 1.83  Ricardo Schaefer 01/18/2012, 8:40 AM  Cardiology Attending Patient interviewed and examined. Discussed with Ricardo Reedy, Ricardo Schaefer.  Above note annotated and modified based upon my findings.  No evidence for acute coronary syndrome. Abnormal CT findings raise the question of a noncardiac source for pain. For now, I would pursue assessment of his right upper quadrant mass and deferred additional cardiac testing. We will be happy to follow in office for his cardiac issues.  [place] Bing, MD 01/22/2012, 12:22 PM

## 2012-01-18 NOTE — Progress Notes (Signed)
UR Chart Review Completed  

## 2012-01-18 NOTE — Discharge Summary (Signed)
Physician Discharge Summary  Ricardo Schaefer WUJ:[id number] DOB: [birth year] DOA: 01/16/2012  PCP: Leo Grosser, MD  Admit date: 01/16/2012 Discharge date: 01/18/2012  Recommendations for Outpatient Follow-up:  1. Followup with oncology in middle of September for his lymphoma, Dr. Mariel Sleet.   Discharge Diagnoses:  1. Left-sided chest pain, nonspecific, no clear etiology. No evidence of cardiac ischemia or infarction. 2. Low-grade B-cell follicular lymphoma, evidence of this on CT chest scan. 3. Coronary artery disease, stable. 4. Hypertension. 5. Carotid artery disease. 6. Peripheral arterial disease.   Discharge Condition: Stable and improved.  Diet recommendation: Regular.  Filed Weights   01/16/12 1315 01/16/12 1702  Weight: 75.297 kg (166 lb) 75.3 kg (166 lb 0.1 oz)    History of present illness:  This 78 year old man was admitted to the hospital with symptoms of left-sided chest pain. The pain and been present for approximately 3 days prior to hospitalization and was intermittent in nature. It usually lasted only less than a minute. The pain was described as sharp associated with some dyspnea. He does have a history of low-grade T-cell lymphoma which was diagnosed in 2006 and he has seen our oncologist for this in March of 2013.  Hospital Course:  He was admitted to telemetry floor and serial cardiac enzymes were done, these were negative. He was seen by Dr. Dietrich Pates, cardiology, who felt that indeed his pain was not cardiac in origin. He felt that the pain may well be coming for his lymphoma. The patient now says that his pain is significantly improved, almost gone. CT scan of the chest was done yesterday which shows presence of lymphoma radiologically, I do not have any other CT scans to compare with to see if there is any progression. He is keen to go home today.  Procedures:  None.  Consultations:  Cardiology, Dr. Dietrich Pates.  Discharge Exam: Filed Vitals:   01/18/12  0531  BP: 162/53  Pulse: 62  Temp: 98.5 F (36.9 C)  Resp: 20   Filed Vitals:   01/17/12 0607 01/17/12 1430 01/17/12 2007 01/18/12 0531  BP: 157/60 146/72 128/64 162/53  Pulse: 68 68 71 62  Temp: 98.1 F (36.7 C) 98.1 F (36.7 C) 97.4 F (36.3 C) 98.5 F (36.9 C)  TempSrc: Oral Oral Oral Oral  Resp: 20 20 20 20   Height:      Weight:      SpO2: 99% 92% 98% 99%    General: He looks systemically well, not toxic or septic. Cardiovascular: Heart sounds are present and normal without murmurs. There is no pericardial rub. Respiratory: Lung fields are clear. He is alert and orientated without any focal neurologic signs.  Discharge Instructions  Discharge Orders    Future Appointments: Provider: Department: Dept Phone: Center:   02/03/2012 10:10 AM [facility] [phone] None   02/06/2012 11:30 AM Randall An, MD [facility] [phone] None     Future Orders Please Complete By Expires   Diet - low sodium heart healthy      Increase activity slowly        Medication List  As of 01/18/2012  8:49 AM   TAKE these medications         ALPRAZolam 1 MG tablet   Commonly known as: XANAX   Take 1 tablet by mouth Three times daily as needed. Panic attacks      chlorpheniramine 4 MG tablet   Commonly known as: CHLOR-TRIMETON   Take 4 mg by mouth daily as needed. For sinus/allergies  clopidogrel 75 MG tablet   Commonly known as: PLAVIX   Take 75 mg by mouth every morning.      doxazosin 4 MG tablet   Commonly known as: CARDURA   Take 4 mg by mouth every morning.      hydrALAZINE 25 MG tablet   Commonly known as: APRESOLINE   Take 1 tablet by mouth Twice daily.      hydrochlorothiazide 12.5 MG capsule   Commonly known as: MICROZIDE   Take 12.5 mg by mouth every morning.      lactose free nutrition Liqd   Take 1 Container by mouth daily.      LORTAB 5 5-500 MG per tablet   Generic drug: HYDROcodone-acetaminophen   Take 1 tablet by mouth  every 6 (six) hours as needed. For pain      lovastatin 40 MG tablet   Commonly known as: MEVACOR   Take 40 mg by mouth at bedtime.      metoprolol 50 MG tablet   Commonly known as: LOPRESSOR   Take 50 mg by mouth 2 (two) times daily.      NIFEdipine 60 MG 24 hr tablet   Commonly known as: PROCARDIA XL/ADALAT-CC   Take 60 mg by mouth every morning.      pantoprazole 40 MG tablet   Commonly known as: PROTONIX   Take 40 mg by mouth every morning.      Tamsulosin HCl 0.4 MG Caps   Commonly known as: FLOMAX   Take 0.4 mg by mouth daily after supper.      venlafaxine XR 150 MG 24 hr capsule   Commonly known as: EFFEXOR-XR   Take 150 mg by mouth every morning.              The results of significant diagnostics from this hospitalization (including imaging, microbiology, ancillary and laboratory) are listed below for reference.    Significant Diagnostic Studies: Ct Chest Wo Contrast  01/17/2012  *RADIOLOGY REPORT*  Clinical Data: Lambert Mody left-sided chest pain radiating to the left shoulder.  Dyspnea. History of non-Hodgkins lymphoma and melanoma. History of pericardial effusion.  CT CHEST WITHOUT CONTRAST  Technique:  Multidetector CT imaging of the chest was performed following the standard protocol without IV contrast.  Comparison: Chest x-ray dated 01/16/2012  Findings: The patient has prominent bilateral axillary adenopathy. The largest lymph nodes in the left axilla and measures 4.7 x 3.3 x 2.3 cm.  The patient also has retrocrural adenopathy, more prominent on the left than on the right. There are several slightly prominent lymph nodes in the pericardial fat.  Tiny pericardial effusion.  There is a large soft tissue mass in the upper abdomen in the hilum of the liver measuring 10.8 x 813.4 by greater than 10.0 cm.  This is adjacent to the head of the pancreas and the caudate lobe of the liver and the upper pole of the right kidney.  There is abnormal soft tissue in the right  pericolic gutter as well. Adenopathy adjacent to the origin of the left side of the celiac artery, 3.0 cm in diameter.  There are no pulmonary infiltrates or effusions or masses.  There is pleural calcification on the right hemidiaphragm.  Heart size is normal.  Extensive coronary artery and aortic calcifications.  No significant osseous abnormality.  IMPRESSION:  1.  Axillary and retrocrural adenopathy.  Large soft tissue mass in the right side of the upper abdomen may represent lymphoma. 2.  Tiny pericardial effusion.  Original Report Authenticated By: Gwynn Burly, M.D.    Dg Chest Portable 1 View  01/16/2012  *RADIOLOGY REPORT*  Clinical Data: Chest pain  PORTABLE CHEST - 1 VIEW  Comparison: None.  Findings: 1517 hours. The lungs are clear without focal infiltrate, edema, pneumothorax or pleural effusion. The cardiopericardial silhouette is enlarged. Imaged bony structures of the thorax are intact. Telemetry leads overlie the chest.  IMPRESSION: No acute cardiopulmonary findings.   Original Report Authenticated By: ERIC A. MANSELL, M.D.        Labs: Basic Metabolic Panel:  Lab 01/17/12 1610 01/16/12 1400  NA 138 136  K 4.1 4.3  CL 102 100  CO2 28 27  GLUCOSE 91 167*  BUN 31* 29*  CREATININE 1.83* 1.74*  CALCIUM 10.4 10.5  MG -- --  PHOS -- --   Liver Function Tests:  Lab 01/17/12 0533 01/16/12 1400  AST 19 21  ALT 11 10  ALKPHOS 75 81  BILITOT 0.2* 0.3  PROT 6.1 6.5  ALBUMIN 3.3* 3.6     CBC:  Lab 01/17/12 0533 01/16/12 1400  WBC 6.9 7.6  NEUTROABS -- 5.5  HGB 12.7* 13.8  HCT 38.2* 41.3  MCV 90.7 91.0  PLT 145* 156   Cardiac Enzymes:  Lab 01/17/12 0826 01/17/12 0011 01/16/12 1620 01/16/12 1400  CKTOTAL 22 24 24  --  CKMB 1.6 1.7 1.7 --  CKMBINDEX -- -- -- --  TROPONINI <0.30 <0.30 <0.30 <0.30      Time coordinating discharge: Greater than 30 minutes  Signed:  Micaiah Remillard C  [facility] 01/18/2012, 8:49 AM

## 2012-01-18 NOTE — Plan of Care (Signed)
Problem: Discharge Progression Outcomes Goal: No anginal pain Outcome: Completed/Met Date Met:  01/18/12 Pt denies pain Goal: Other Discharge Outcomes/Goals Outcome: Completed/Met Date Met:  01/18/12 Discharge instructions read to pt and his wife Both verbalized understanding of all instructions  IV removed from rt [facility] Cath tip intact Site benign  Discharged to home with spouse

## 2012-02-03 ENCOUNTER — Encounter (HOSPITAL_COMMUNITY): Payer: Medicare HMO | Attending: Oncology

## 2012-02-03 DIAGNOSIS — I2510 Atherosclerotic heart disease of native coronary artery without angina pectoris: Secondary | ICD-10-CM | POA: Insufficient documentation

## 2012-02-03 DIAGNOSIS — J4489 Other specified chronic obstructive pulmonary disease: Secondary | ICD-10-CM | POA: Insufficient documentation

## 2012-02-03 DIAGNOSIS — C8589 Other specified types of non-Hodgkin lymphoma, extranodal and solid organ sites: Secondary | ICD-10-CM

## 2012-02-03 DIAGNOSIS — J449 Chronic obstructive pulmonary disease, unspecified: Secondary | ICD-10-CM | POA: Insufficient documentation

## 2012-02-03 DIAGNOSIS — C8590 Non-Hodgkin lymphoma, unspecified, unspecified site: Secondary | ICD-10-CM

## 2012-02-03 DIAGNOSIS — I739 Peripheral vascular disease, unspecified: Secondary | ICD-10-CM | POA: Insufficient documentation

## 2012-02-03 DIAGNOSIS — N189 Chronic kidney disease, unspecified: Secondary | ICD-10-CM | POA: Insufficient documentation

## 2012-02-03 DIAGNOSIS — C8299 Follicular lymphoma, unspecified, extranodal and solid organ sites: Secondary | ICD-10-CM | POA: Insufficient documentation

## 2012-02-03 DIAGNOSIS — F17200 Nicotine dependence, unspecified, uncomplicated: Secondary | ICD-10-CM | POA: Insufficient documentation

## 2012-02-03 DIAGNOSIS — F410 Panic disorder [episodic paroxysmal anxiety] without agoraphobia: Secondary | ICD-10-CM | POA: Insufficient documentation

## 2012-02-03 LAB — CBC
HCT: 41.2 % (ref 39.0–52.0)
Hemoglobin: 13.8 g/dL (ref 13.0–17.0)
MCH: 30.3 pg (ref 26.0–34.0)
MCHC: 33.5 g/dL (ref 30.0–36.0)

## 2012-02-03 LAB — COMPREHENSIVE METABOLIC PANEL
ALT: 12 U/L (ref 0–53)
Alkaline Phosphatase: 85 U/L (ref 39–117)
CO2: 28 mEq/L (ref 19–32)
Calcium: 10.2 mg/dL (ref 8.4–10.5)
Chloride: 96 mEq/L (ref 96–112)
GFR calc Af Amer: 36 mL/min — ABNORMAL LOW (ref >90)
GFR calc non Af Amer: 31 mL/min — ABNORMAL LOW (ref >90)
Glucose, Bld: 103 mg/dL — ABNORMAL HIGH (ref 70–99)
Sodium: 134 mEq/L — ABNORMAL LOW (ref 135–145)
Total Bilirubin: 0.3 mg/dL (ref 0.3–1.2)

## 2012-02-03 LAB — DIFFERENTIAL
Basophils Relative: 1 % (ref 0–1)
Monocytes Absolute: 0.8 10*3/uL (ref 0.1–1.0)
Monocytes Relative: 9 % (ref 3–12)
Neutro Abs: 5.8 10*3/uL (ref 1.7–7.7)

## 2012-02-03 NOTE — Progress Notes (Signed)
Labs drawn today for cbc/diff,cmp,ldh 

## 2012-02-06 ENCOUNTER — Encounter (HOSPITAL_COMMUNITY): Payer: Self-pay | Admitting: Oncology

## 2012-02-06 ENCOUNTER — Encounter (HOSPITAL_BASED_OUTPATIENT_CLINIC_OR_DEPARTMENT_OTHER): Payer: Medicare HMO | Admitting: Oncology

## 2012-02-06 VITALS — BP 139/58 | HR 73 | Temp 97.8°F | Resp 18 | Wt 168.8 lb

## 2012-02-06 DIAGNOSIS — F17200 Nicotine dependence, unspecified, uncomplicated: Secondary | ICD-10-CM

## 2012-02-06 DIAGNOSIS — J449 Chronic obstructive pulmonary disease, unspecified: Secondary | ICD-10-CM

## 2012-02-06 DIAGNOSIS — C8299 Follicular lymphoma, unspecified, extranodal and solid organ sites: Secondary | ICD-10-CM

## 2012-02-06 DIAGNOSIS — F410 Panic disorder [episodic paroxysmal anxiety] without agoraphobia: Secondary | ICD-10-CM

## 2012-02-06 MED ORDER — ALPRAZOLAM 0.5 MG PO TABS: ORAL_TABLET | ORAL | Status: DC

## 2012-02-06 NOTE — Progress Notes (Signed)
#  1 low-grade B-cell follicular lymphoma diagnosed in 2006 and followed initially by Dr. Hyacinth Meeker at the [facility] [place] in [place]. He has never been treated for this and in my opinion still does not require therapy. #2 COPD still smoking one half pack of cigarettes a day #3 panic attacks on alprazolam on a PRN basis. I have asked him to consider 0.5 mg at 8 AM noon and 4 PM, and at bedtime and to call us in 3-4 weeks. #4 CAD and PVD #5 CKD  He was recently in the hospital with chest pain that did not turn out to be cardiac in nature. His CAT scan however showed diffuse adenopathy in the upper abdomen and chest and axillary areas especially the left. He still has no B. symptomatology. He has a single lymph node in the posterior cervical/supraclavicular area on each side 1 cm in size. He has a 2 x 2 cm lymph node in the left axilla and that is all I can feel. I do not feel an abdominal mass. His lungs show markedly diminished breath sounds still. He has hyperresonance to percussion. His heart reveals no distinct murmur rub or gallop. He has no peripheral edema. His skin exam shows benign seborrheic keratoses over his neck and chest area which she wanted me to look at very closely.  He remains anxious but without symptoms warranting treatment for his non-Hodgkin's lymphoma. We will see him back in 8 weeks to see if he is any better or if his physical exam has changed. He has lost 3 pounds in 6 months only. I think his biggest problem remains his heart disease potentially, his lung disease, and his anxiety issues. I think he needs a regular schedule of anti-anxiety medication

## 2012-02-06 NOTE — Patient Instructions (Addendum)
[facility]  Discharge Instructions Ricardo Schaefer  DOB Oct 02, 1926 CSN [id number]  MRN [id number] Dr. Glenford Peers  RECOMMENDATIONS MADE BY THE CONSULTANT AND ANY TEST RESULTS WILL BE SENT TO YOUR REFERRING DOCTOR.   EXAM FINDINGS BY MD TODAY AND SIGNS AND SYMPTOMS TO REPORT TO CLINIC OR PRIMARY MD: Dr. Mariel Sleet performed exam today.  MEDICATIONS PRESCRIBED:  Xanax 0.5 mg at 8 am-12noon-4pm and at bedtime. Call us in 3 weeks to let us know how this is working [phone] INSTRUCTIONS GIVEN AND DISCUSSED: No reason to treat lymphoma at this time  SPECIAL INSTRUCTIONS/FOLLOW-UP: Return in 8 weeks   I acknowledge that I have been informed and understand all the instructions given to me and received a copy. I do not have any more questions at this time, but understand that I may call the [facility]ty [facility] at [facility] at [phone] during business hours should I have any further questions or need assistance in obtaining follow-up care.    __________________________________________  _____________  __________ Signature of Patient or Authorized Representative            Date                   Time    __________________________________________ Nurse's Signature

## 2012-02-10 MED ORDER — PEGFILGRASTIM INJECTION 6 MG/0.6ML
SUBCUTANEOUS | Status: AC
  Filled 2012-02-10: qty 0.6

## 2012-02-21 ENCOUNTER — Encounter: Payer: Self-pay | Admitting: Cardiology

## 2012-02-21 ENCOUNTER — Ambulatory Visit (INDEPENDENT_AMBULATORY_CARE_PROVIDER_SITE_OTHER): Payer: Medicare HMO | Admitting: Cardiology

## 2012-02-21 VITALS — BP 130/50 | HR 76 | Ht 69.0 in | Wt 169.4 lb

## 2012-02-21 DIAGNOSIS — I2510 Atherosclerotic heart disease of native coronary artery without angina pectoris: Secondary | ICD-10-CM

## 2012-02-21 DIAGNOSIS — E782 Mixed hyperlipidemia: Secondary | ICD-10-CM

## 2012-02-21 DIAGNOSIS — I10 Essential (primary) hypertension: Secondary | ICD-10-CM

## 2012-02-21 DIAGNOSIS — I739 Peripheral vascular disease, unspecified: Secondary | ICD-10-CM

## 2012-02-21 NOTE — Progress Notes (Signed)
Clinical Summary Ricardo Schaefer is a 78 y.o.male presenting for followup. I last saw him in May of this year. Interval follow up with Dr. Kirke Corin was reviewed. Hospital records from recent admission to [facility] in August reviewed, with atypical chest pain, seen in consultation by Dr. Dietrich Pates. No additional cardiac testing was pursued. Recent note from Dr. Mariel Sleet reviewed.  He is here with his wife today. Overall seems to be relatively stable, no obvious angina symptoms. Still limited by claudication and dyspnea on exertion.  We reviewed his medications. He states that he does not want to go back to see Dr. Kary Kos. Will follow up with repeat noninvasive arterial studies down the road.   Allergies  Allergen Reactions  . Aspirin     Causes stomach bleeds    Current Outpatient Prescriptions  Medication Sig Dispense Refill  . ALPRAZolam (XANAX) 0.5 MG tablet Take 1 at 8 am, noon, 4 pm and hs  120 tablet  1  . chlorpheniramine (CHLOR-TRIMETON) 4 MG tablet Take 4 mg by mouth daily as needed. For sinus/allergies      . clopidogrel (PLAVIX) 75 MG tablet Take 75 mg by mouth every morning.       Marland Kitchen doxazosin (CARDURA) 4 MG tablet Take 4 mg by mouth every morning.       . hydrALAZINE (APRESOLINE) 25 MG tablet Take 1 tablet by mouth Twice daily.       . hydrochlorothiazide (MICROZIDE) 12.5 MG capsule Take 12.5 mg by mouth every morning.       Marland Kitchen HYDROcodone-acetaminophen (LORTAB 5) 5-500 MG per tablet Take 1 tablet by mouth every 6 (six) hours as needed. For pain      . lactose free nutrition (BOOST) LIQD Take 1 Container by mouth daily.      Marland Kitchen lovastatin (MEVACOR) 40 MG tablet Take 40 mg by mouth at bedtime.       . metoprolol (LOPRESSOR) 50 MG tablet Take 50 mg by mouth 2 (two) times daily.       Marland Kitchen NIFEdipine (PROCARDIA XL/ADALAT-CC) 60 MG 24 hr tablet Take 60 mg by mouth every morning.       . pantoprazole (PROTONIX) 40 MG tablet Take 40 mg by mouth every morning.       . Tamsulosin HCl (FLOMAX)  0.4 MG CAPS Take 0.4 mg by mouth daily after supper.       . venlafaxine (EFFEXOR-XR) 150 MG 24 hr capsule Take 150 mg by mouth every morning.         Past Medical History  Diagnosis Date  . Coronary atherosclerosis of native coronary artery     Occluded RCA with collaterals, DES to LAD and circumflex 2005  . Essential hypertension, benign   . Non Hodgkin's lymphoma   . Chronic renal insufficiency   . Hyperlipidemia   . Degenerative disc disease, lumbar   . Duodenal ulcer     EGD 6/11 - iron deficiency anemia  . Pericardial effusion     Status post window  . Carotid artery disease     Nonobstructive, less than 50% LICA by angiography 5/12  . Stroke     Occurred 1999, had left atrial appendage thrombus at that time - previously on Coumadin  . Peripheral arterial disease   . Enlarged prostate   . Lymphoma   . Melanoma     Past Surgical History  Procedure Date  . Cholecystectomy   . Pericardial window   . Aortobifemoral bypass grafting 2005  . Left  femoral popliteal bypass grafting 2005  . Coronary angioplasty with stent placement     Social History Ricardo Schaefer reports that he has been smoking Cigarettes.  He has been smoking about .5 packs per day. He has never used smokeless tobacco. Ricardo Schaefer reports that he does not drink alcohol.  Review of Systems No palpitations, no falls. Denies any major bleeding problems. As outlined above.  Physical Examination Filed Vitals:   02/21/12 1313  BP: 130/50  Pulse: 76   Filed Weights   02/21/12 1313  Weight: 169 lb 6.4 oz (76.839 kg)    HEENT: Conjunctiva and lids normal, oropharynx with moist mucosa.  Neck: Supple, no elevated JVP, bilateral carotid bruits are noted, no thyromegaly.  Lungs: Decreased breath sounds, nonlabored breathing.  Cardiac: Regular rate and rhythm with 2/6 systolic murmur, no pericardial rub or S3 gallop.  Abdomen: Protuberant, bowel sounds present, no rebound or guarding.  Extremities: No pitting  edema, distal pulses diminished, venous stasis and some cyanosis noted involving the feet.    Problem List and Plan   Coronary atherosclerosis of native coronary artery Recent hospital admission reviewed. Patient is clinically stable at this point. Continue medical therapy and observation.  Essential hypertension, benign No change to current antihypertensive regimen.  Mixed hyperlipidemia Continue statin therapy, follow up with Dr. Tanya Nones.  Peripheral arterial disease Symptomatic as outlined previously. Patient prefers overall conservative approach this time. He states he does not want to go back to see Dr. Kirke Corin.    Jonelle Sidle, M.D., F.A.C.C.

## 2012-02-21 NOTE — Patient Instructions (Addendum)
Your physician recommends that you schedule a follow-up appointment in: 3 months.  

## 2012-02-21 NOTE — Assessment & Plan Note (Signed)
Continue statin therapy, follow up with Dr. Tanya Nones.

## 2012-02-21 NOTE — Assessment & Plan Note (Signed)
No change to current antihypertensive regimen. 

## 2012-02-21 NOTE — Assessment & Plan Note (Signed)
Recent hospital admission reviewed. Patient is clinically stable at this point. Continue medical therapy and observation.

## 2012-02-21 NOTE — Assessment & Plan Note (Signed)
Symptomatic as outlined previously. Patient prefers overall conservative approach this time. He states he does not want to go back to see Dr. Kirke Corin.

## 2012-04-09 ENCOUNTER — Encounter (HOSPITAL_COMMUNITY): Payer: Medicare HMO | Attending: Oncology | Admitting: Oncology

## 2012-04-09 VITALS — BP 121/58 | HR 79 | Temp 96.6°F | Resp 16 | Wt 169.3 lb

## 2012-04-09 DIAGNOSIS — F341 Dysthymic disorder: Secondary | ICD-10-CM

## 2012-04-09 DIAGNOSIS — F17200 Nicotine dependence, unspecified, uncomplicated: Secondary | ICD-10-CM

## 2012-04-09 DIAGNOSIS — C8299 Follicular lymphoma, unspecified, extranodal and solid organ sites: Secondary | ICD-10-CM

## 2012-04-09 DIAGNOSIS — J449 Chronic obstructive pulmonary disease, unspecified: Secondary | ICD-10-CM

## 2012-04-09 DIAGNOSIS — C8590 Non-Hodgkin lymphoma, unspecified, unspecified site: Secondary | ICD-10-CM

## 2012-04-09 DIAGNOSIS — C8589 Other specified types of non-Hodgkin lymphoma, extranodal and solid organ sites: Secondary | ICD-10-CM | POA: Insufficient documentation

## 2012-04-09 DIAGNOSIS — F410 Panic disorder [episodic paroxysmal anxiety] without agoraphobia: Secondary | ICD-10-CM | POA: Insufficient documentation

## 2012-04-09 MED ORDER — ALPRAZOLAM 0.5 MG PO TABS: ORAL_TABLET | ORAL | Status: DC

## 2012-04-09 NOTE — Progress Notes (Signed)
Problem #1 low-grade B-cell follicular lymphoma diagnosed in 2006 by Dr. Hyacinth Meeker at the [facility] in [place]. He is not require therapy and still does not require therapy in my opinion. Problem #2 chronic anxiety with panic attacks with some element of depression as well. He is on Effexor in Xanax 0.5 mg at a.m., noon, 4 PM, and 8 PM.  his wife states this is the best he has been and at least 5 years. Problem #3 COPD still smoking Problem #4 CAD and PVD Problem #5 CAD He looks the best I have seen him. He is much less anxious and essentially has steadily to panic attacks he states since being placed on the regular schedule alprazolam 4 times a day in September. He still has no B. symptomatology from his lymphoma. He also states his umbilical hernia does not bother him. Vital signs are stable. Lymph nodes are negative clinically in the cervical, subclavicular, interclavicular, epitrochlear, necessary and inguinal areas. He has no obvious hepatosplenomegaly that I can appreciate or abdominal masses. I could not feel the left axillary lymph node today. His lungs are clear but with markedly diminished breath sounds. His heart shows a regular rhythm and rate without obvious murmur or gallop but he does have distant heart sounds. He has no leg edema no arm edema.  We will over his lab work from September which was excellent other than his chronic kidney disease issues and his creatinine 1.93. We will see him in 3 months this time with blood work before he comes for the visit. I do not see a reason do more scans at this time. I think with his degree of severe anxiety and depression, the medication regimen is very reasonable at this time

## 2012-04-09 NOTE — Patient Instructions (Addendum)
[facility]  Discharge Instructions  RECOMMENDATIONS MADE BY THE CONSULTANT AND ANY TEST RESULTS WILL BE SENT TO YOUR REFERRING DOCTOR.   EXAM FINDINGS BY MD TODAY AND SIGNS AND SYMPTOMS TO REPORT TO CLINIC OR PRIMARY MD: exam and discussion by MD.  Bonita Quin are doing well.  Report recurring infections, night sweats, any lumps, etc.  MEDICATIONS PRESCRIBED: refills for xanax     SPECIAL INSTRUCTIONS/FOLLOW-UP: Lab work Needed in 3 months and Return to Clinic after labs in 3 months.   I acknowledge that I have been informed and understand all the instructions given to me and received a copy. I do not have any more questions at this time, but understand that I may call the Specialty Clinic at [facility] at [phone] during business hours should I have any further questions or need assistance in obtaining follow-up care.    __________________________________________  _____________  __________ Signature of Patient or Authorized Representative            Date                   Time    __________________________________________ Nurse's Signature

## 2012-05-28 ENCOUNTER — Encounter: Payer: Self-pay | Admitting: Cardiology

## 2012-05-28 ENCOUNTER — Encounter: Payer: Medicare HMO | Admitting: Cardiology

## 2012-05-28 NOTE — Progress Notes (Signed)
Cancelled visit. This encounter was created in error - please disregard.

## 2012-07-16 ENCOUNTER — Encounter (HOSPITAL_COMMUNITY): Payer: MEDICARE | Attending: Oncology

## 2012-07-16 DIAGNOSIS — F410 Panic disorder [episodic paroxysmal anxiety] without agoraphobia: Secondary | ICD-10-CM | POA: Insufficient documentation

## 2012-07-16 DIAGNOSIS — C8589 Other specified types of non-Hodgkin lymphoma, extranodal and solid organ sites: Secondary | ICD-10-CM | POA: Insufficient documentation

## 2012-07-16 LAB — CBC WITH DIFFERENTIAL/PLATELET
Basophils Absolute: 0.1 10*3/uL (ref 0.0–0.1)
Basophils Relative: 1 % (ref 0–1)
Eosinophils Absolute: 0.3 10*3/uL (ref 0.0–0.7)
Hemoglobin: 13.1 g/dL (ref 13.0–17.0)
MCHC: 32.7 g/dL (ref 30.0–36.0)
Neutro Abs: 4.7 10*3/uL (ref 1.7–7.7)
Neutrophils Relative %: 64 % (ref 43–77)
Platelets: 154 10*3/uL (ref 150–400)
RDW: 13.8 % (ref 11.5–15.5)

## 2012-07-16 LAB — COMPREHENSIVE METABOLIC PANEL
Albumin: 3.7 g/dL (ref 3.5–5.2)
Alkaline Phosphatase: 77 U/L (ref 39–117)
BUN: 30 mg/dL — ABNORMAL HIGH (ref 6–23)
Calcium: 10.3 mg/dL (ref 8.4–10.5)
GFR calc Af Amer: 35 mL/min — ABNORMAL LOW (ref >90)
Glucose, Bld: 84 mg/dL (ref 70–99)
Potassium: 4.2 mEq/L (ref 3.5–5.1)
Total Protein: 6.5 g/dL (ref 6.0–8.3)

## 2012-07-16 NOTE — Progress Notes (Signed)
Labs drawn today for cbc/diff,ldh,cmp,b2mic 

## 2012-07-17 LAB — BETA 2 MICROGLOBULIN, SERUM: Beta-2 Microglobulin: 5.84 mg/L — ABNORMAL HIGH (ref 1.01–1.73)

## 2012-07-23 ENCOUNTER — Ambulatory Visit (HOSPITAL_COMMUNITY): Payer: Medicare HMO | Admitting: Oncology

## 2012-07-26 ENCOUNTER — Ambulatory Visit (HOSPITAL_COMMUNITY): Payer: Medicare HMO | Admitting: Oncology

## 2012-07-31 ENCOUNTER — Encounter: Payer: Self-pay | Admitting: Cardiology

## 2012-07-31 ENCOUNTER — Ambulatory Visit (INDEPENDENT_AMBULATORY_CARE_PROVIDER_SITE_OTHER): Payer: MEDICARE | Admitting: Cardiology

## 2012-07-31 VITALS — BP 160/68 | HR 78 | Ht 70.0 in | Wt 168.8 lb

## 2012-07-31 DIAGNOSIS — I10 Essential (primary) hypertension: Secondary | ICD-10-CM

## 2012-07-31 NOTE — Assessment & Plan Note (Signed)
Symptomatically stable at this point. Continue medical therapy and observation. ECG reviewed.

## 2012-07-31 NOTE — Assessment & Plan Note (Signed)
Followed by Dr. Tanya Nones. Has been well controlled over time based on previous lab work. Continue statin.

## 2012-07-31 NOTE — Patient Instructions (Addendum)
Your physician recommends that you schedule a follow-up appointment in: 6 months  

## 2012-07-31 NOTE — Assessment & Plan Note (Signed)
Blood pressure up significantly today, but normal at November visit. I asked him to check blood pressures at home and make a recording for subsequent health care visits. May need further medication adjustments.

## 2012-07-31 NOTE — Progress Notes (Signed)
Clinical Summary Mr. Kovalenko is a medically complex 79 y.o.male last seen in October 2013. He is here with his wife. He reports no major change in chronic shortness of breath, no angina symptoms or nitroglycerin use.  Recent lab work from February showed potassium 4.2, BUN 30, creatinine 1.9 which was stable with GFR 30, hemoglobin 13.1, platelets 154.  ECG today shows sinus rhythm with nonspecific T changes.   Blood pressure elevated today. Not typically in this range per discussion with him. Blood pressure at November visit was 121/58. He does have a blood pressure monitor at home I asked him to check it more regularly to see if any medicine adjustments were needed.  Allergies  Allergen Reactions  . Aspirin     Causes stomach bleeds    Current Outpatient Prescriptions  Medication Sig Dispense Refill  . ALPRAZolam (XANAX) 0.5 MG tablet Take 1 at 8 am, noon, 4 pm and hs  120 tablet  1  . chlorpheniramine (CHLOR-TRIMETON) 4 MG tablet Take 4 mg by mouth daily as needed. For sinus/allergies      . clopidogrel (PLAVIX) 75 MG tablet Take 75 mg by mouth every morning.       Marland Kitchen doxazosin (CARDURA) 4 MG tablet Take 4 mg by mouth every morning.       . hydrALAZINE (APRESOLINE) 25 MG tablet Take 1 tablet by mouth Twice daily.       Marland Kitchen HYDROcodone-acetaminophen (LORTAB 5) 5-500 MG per tablet Take 1 tablet by mouth every 6 (six) hours as needed. For pain      . lactose free nutrition (BOOST) LIQD Take 1 Container by mouth daily.      Marland Kitchen lovastatin (MEVACOR) 40 MG tablet Take 40 mg by mouth at bedtime.       . metoprolol (LOPRESSOR) 50 MG tablet Take 50 mg by mouth 2 (two) times daily.       Marland Kitchen NIFEdipine (PROCARDIA XL/ADALAT-CC) 60 MG 24 hr tablet Take 60 mg by mouth every morning.       . pantoprazole (PROTONIX) 40 MG tablet Take 40 mg by mouth every morning.       . senna-docusate (SENOKOT-S) 8.6-50 MG per tablet Take 1 tablet by mouth daily.      . Tamsulosin HCl (FLOMAX) 0.4 MG CAPS Take 0.4 mg  by mouth daily after supper.       . venlafaxine (EFFEXOR-XR) 150 MG 24 hr capsule Take 150 mg by mouth every morning.        No current facility-administered medications for this visit.    Past Medical History  Diagnosis Date  . Coronary atherosclerosis of native coronary artery     Occluded RCA with collaterals, DES to LAD and circumflex 2005  . Essential hypertension, benign   . Non Hodgkin's lymphoma   . Chronic renal insufficiency   . Hyperlipidemia   . Degenerative disc disease, lumbar   . Duodenal ulcer     EGD 6/11 - iron deficiency anemia  . Pericardial effusion     Status post window  . Carotid artery disease     Nonobstructive, less than 50% LICA by angiography 5/12  . Stroke     Occurred 1999, had left atrial appendage thrombus at that time - previously on Coumadin  . Peripheral arterial disease   . Enlarged prostate   . Lymphoma   . Melanoma     Social History Mr. Langenbach reports that he has been smoking Cigarettes.  He has been smoking about  0.50 packs per day. He has never used smokeless tobacco. Mr. Terrio reports that he does not drink alcohol.  Review of Systems No palpitations. No spontaneous bleeding. Using an incentive spirometer at home. No syncope. Otherwise negative.  Physical Examination Filed Vitals:   07/31/12 1446  BP: 160/68  Pulse: 78   Filed Weights   07/31/12 1446  Weight: 168 lb 12.8 oz (76.567 kg)    HEENT: Conjunctiva and lids normal, oropharynx with moist mucosa.  Neck: Supple, no elevated JVP, bilateral carotid bruits are noted, no thyromegaly.  Lungs: Decreased breath sounds, nonlabored breathing.  Cardiac: Regular rate and rhythm with 2/6 systolic murmur, no pericardial rub or S3 gallop.  Abdomen: Protuberant, bowel sounds present, no rebound or guarding.  Extremities: No pitting edema, distal pulses diminished, venous stasis and some cyanosis noted involving the feet.    Problem List and Plan   Coronary atherosclerosis of  native coronary artery Symptomatically stable at this point. Continue medical therapy and observation. ECG reviewed.  Essential hypertension, benign Blood pressure up significantly today, but normal at November visit. I asked him to check blood pressures at home and make a recording for subsequent health care visits. May need further medication adjustments.  Mixed hyperlipidemia Followed by Dr. Tanya Nones. Has been well controlled over time based on previous lab work. Continue statin.    Jonelle Sidle, M.D., F.A.C.C.

## 2012-08-08 ENCOUNTER — Other Ambulatory Visit: Payer: Self-pay | Admitting: Family Medicine

## 2012-08-08 MED ORDER — VENLAFAXINE HCL ER 150 MG PO CP24: 150.0000 mg | ORAL_CAPSULE | ORAL | Status: DC

## 2012-08-08 MED ORDER — METOPROLOL TARTRATE 50 MG PO TABS: 50.0000 mg | ORAL_TABLET | Freq: Two times a day (BID) | ORAL | Status: DC

## 2012-08-08 NOTE — Telephone Encounter (Signed)
Medication refilled per protocol. 

## 2012-08-10 ENCOUNTER — Telehealth: Payer: Self-pay | Admitting: Family Medicine

## 2012-08-10 NOTE — Telephone Encounter (Signed)
Ok to refill  Hydrocodone 5/500mg  #60/0. Patient will be notified to schedule ov.

## 2012-08-10 NOTE — Telephone Encounter (Signed)
Med c/o and changed to Vicodin 5/325mg  1 po bid/ appt. Made for pt.

## 2012-08-10 NOTE — Telephone Encounter (Signed)
Ok to call in

## 2012-08-14 DIAGNOSIS — C8290 Follicular lymphoma, unspecified, unspecified site: Secondary | ICD-10-CM | POA: Insufficient documentation

## 2012-08-14 NOTE — Progress Notes (Signed)
Leo Grosser, MD [address] [place] [address]  Low-Grade, B-Cell, Follicular lymphoma - Plan: CBC with Differential, Comprehensive metabolic panel, Lactate dehydrogenase, Sedimentation rate  CURRENT THERAPY: Observation  INTERVAL HISTORY: Arley Salamone 79 y.o. male returns for  regular  visit for followup of  Low-grade B-cell follicular lymphoma diagnosed in 2006 by Dr. Hyacinth Meeker at the [facility] in [place]. He is not require therapy and still does not require therapy in our opinion.   I personally reviewed and went over laboratory results with the patient.  Labs are very stable.  LDH is WNL.  B-2 microglobulin level is elevated but is nonspecific.  His CBC is completely WNL.  As a result, he still is not in need of chemotherapeutic intervention at this time.   He reports that in [place] he saw a vascular surgeon years ago who placed stent in his right groin.  His wife reports that the stent is blocked.  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  This is associated with increased pain in his right LE as well.  As a result, we will refer him to vascular surgeon in [place].  The patient was informed that he is not likely a candidate for a major surgery but there may be less invasive treatment options that he may want to learn about, if he is even a candidate for them.   He continues to smoke 1/2 ppd of tobacco.  He was again encouraged to quit.  Smoking cessation education was provided, but this is a mute point because he is not interested in quitting smoking.   Hematologically he denies any complaints and ROS questioning is negative.  He denies any B symptoms. Delford Field is stable.   Past Medical History  Diagnosis Date  . Coronary atherosclerosis of native coronary artery     Occluded RCA with collaterals, DES to LAD and circumflex 2005  . Essential hypertension, benign   . Non Hodgkin's lymphoma   . Chronic renal insufficiency   .  Hyperlipidemia   . Degenerative disc disease, lumbar   . Duodenal ulcer     EGD 6/11 - iron deficiency anemia  . Pericardial effusion     Status post window  . Carotid artery disease     Nonobstructive, less than 50% LICA by angiography 5/12  . Stroke     Occurred 1999, had left atrial appendage thrombus at that time - previously on Coumadin  . Peripheral arterial disease   . Enlarged prostate   . Lymphoma   . Melanoma     has Coronary atherosclerosis of native coronary artery; Mixed hyperlipidemia; Essential hypertension, benign; Carotid artery disease; Peripheral arterial disease; Chest pain at rest; Chronic kidney disease, stage III (moderate); and Low-Grade, B-Cell, Follicular lymphoma on his problem list.     is allergic to aspirin.  Mr. Vangilder had no medications administered during this visit.  Past Surgical History  Procedure Laterality Date  . Cholecystectomy    . Pericardial window    . Aortobifemoral bypass grafting  2005  . Left femoral popliteal bypass grafting  2005  . Coronary angioplasty with stent placement      Denies any headaches, dizziness, double vision, fevers, chills, night sweats, nausea, vomiting, diarrhea, constipation, chest pain, heart palpitations, shortness of breath, blood in stool, black tarry stool, urinary pain, urinary burning, urinary frequency, hematuria.   PHYSICAL EXAMINATION  ECOG PERFORMANCE STATUS: 3 - Symptomatic, >50% confined to bed  [facility]  Vitals:   08/15/12 1000  BP: 118/45  Pulse: 71  Temp: 96 F (35.6 C)  Resp: 16    GENERAL:alert, no distress, well nourished, well developed, comfortable, cooperative and smiling.  Chronically ill appearing SKIN: skin color, texture, turgor are normal, no rashes or significant lesions HEAD: Normocephalic, No masses, lesions, tenderness or abnormalities EYES: normal, Conjunctiva are pink and non-injected EARS: External ears normal OROPHARYNX:mucous membranes are moist  NECK: supple, no  adenopathy, thyroid normal size, non-tender, without nodularity, no stridor, non-tender, trachea midline LYMPH:  no palpable lymphadenopathy, no hepatosplenomegaly BREAST:not examined LUNGS: clear to auscultation and percussion, decreased breath sounds HEART: regular rate & rhythm, no murmurs, no gallops, S1 normal and S2 normal ABDOMEN:abdomen soft, non-tender, normal bowel sounds, no masses or organomegaly and no hepatosplenomegaly BACK: Back symmetric, no curvature., No CVA tenderness EXTREMITIES:less then 2 second capillary refill, no joint deformities, effusion, or inflammation, no edema, no skin discoloration, no clubbing, no cyanosis, positive findings: right groin with non-pulsatile aneurysm.  NEURO: alert & oriented x 3 with fluent speech, no focal motor/sensory deficits, gait normal    LABORATORY DATA: CBC    Component Value Date/Time   WBC 7.4 07/16/2012 1000   RBC 4.28 07/16/2012 1000   HGB 13.1 07/16/2012 1000   HCT 40.1 07/16/2012 1000   PLT 154 07/16/2012 1000   MCV 93.7 07/16/2012 1000   MCH 30.6 07/16/2012 1000   MCHC 32.7 07/16/2012 1000   RDW 13.8 07/16/2012 1000   LYMPHSABS 1.4 07/16/2012 1000   MONOABS 0.9 07/16/2012 1000   EOSABS 0.3 07/16/2012 1000   BASOSABS 0.1 07/16/2012 1000      Chemistry      Component Value Date/Time   NA 139 07/16/2012 1000   K 4.2 07/16/2012 1000   CL 103 07/16/2012 1000   CO2 27 07/16/2012 1000   BUN 30* 07/16/2012 1000   CREATININE 1.93* 07/16/2012 1000      Component Value Date/Time   CALCIUM 10.3 07/16/2012 1000   ALKPHOS 77 07/16/2012 1000   AST 20 07/16/2012 1000   ALT 11 07/16/2012 1000   BILITOT 0.2* 07/16/2012 1000     Results for ELIZER, BOSTIC (MRN [id number]) as of 08/14/2012 17:18  Ref. Range 07/16/2012 10:00  LDH Latest Range: 94-250 U/L 158  Beta-2 Microglobulin Latest Range: 1.01-1.73 mg/L 5.84 (H)      ASSESSMENT:  1. Low-grade B-cell follicular lymphoma diagnosed in 2006 by Dr. Hyacinth Meeker at the [facility] in  [place]. He is not require therapy and still does not require therapy in our opinion. Will only perform imaging studies if necessary. 2. Chronic anxiety with panic attacks with some element of depression as well. He is on Effexor and Xanax 0.5 mg at a.m., noon, 4 PM, and 8 PM. his wife states this is the best he has been and at least 5 years.  3. COPD still smoking  4. CAD  5. PVD, S/P right groin arterial stent placement at [facility] many years ago. 6. Stage III Renal disease    PLAN:  1. I personally reviewed and went over laboratory results with the patient. 2. Labs in 3 months: CBC diff, CMET, LDH, ESR 3. Referral to Vascular Surgeon for consultation. 4. Smoking cessation education provided 5. Return in 3 months for follow-up  All questions were answered. The patient knows to call the clinic with any problems, questions or concerns. We can certainly see the patient much sooner if necessary.  The patient and plan discussed  with Glenford Peers, MD and he is in agreement with the aforementioned. Patient seen and examined by Dr. Mariel Sleet as well.  Yessika Otte

## 2012-08-15 ENCOUNTER — Encounter (HOSPITAL_COMMUNITY): Payer: MEDICARE | Attending: Oncology | Admitting: Oncology

## 2012-08-15 ENCOUNTER — Telehealth (HOSPITAL_COMMUNITY): Payer: Self-pay | Admitting: *Deleted

## 2012-08-15 ENCOUNTER — Encounter (HOSPITAL_COMMUNITY): Payer: Self-pay | Admitting: Oncology

## 2012-08-15 VITALS — BP 118/45 | HR 71 | Temp 96.0°F | Resp 16 | Wt 167.0 lb

## 2012-08-15 DIAGNOSIS — C8290 Follicular lymphoma, unspecified, unspecified site: Secondary | ICD-10-CM

## 2012-08-15 DIAGNOSIS — J449 Chronic obstructive pulmonary disease, unspecified: Secondary | ICD-10-CM

## 2012-08-15 DIAGNOSIS — N289 Disorder of kidney and ureter, unspecified: Secondary | ICD-10-CM

## 2012-08-15 DIAGNOSIS — I728 Aneurysm of other specified arteries: Secondary | ICD-10-CM

## 2012-08-15 DIAGNOSIS — C8299 Follicular lymphoma, unspecified, extranodal and solid organ sites: Secondary | ICD-10-CM

## 2012-08-15 NOTE — Telephone Encounter (Signed)
[facility] called and said they had appt for Ricardo Schaefer on 09/18/12 at 11:00 AM and was on a waiting list if anyone canceled they would call him.Marland KitchenMarland KitchenThey informed his wife as well.

## 2012-08-15 NOTE — Patient Instructions (Addendum)
[facility] Discharge Instructions  RECOMMENDATIONS MADE BY THE CONSULTANT AND ANY TEST RESULTS WILL BE SENT TO YOUR REFERRING PHYSICIAN.  We will make a referral to a Vascular surgeon in [place].  If you have not heard from our office or theirs within 1 week please call us so we can follow up on appointment. Return to clinic in 3 months for lab work then to see MD. Report any issues/concerns to clinic as needed prior to appointments.  Thank you for choosing [facility] to provide your oncology and hematology care.  To afford each patient quality time with our providers, please arrive at least 15 minutes before your scheduled appointment time.  With your help, our goal is to use those 15 minutes to complete the necessary work-up to ensure our physicians have the information they need to help with your evaluation and healthcare recommendations.    Effective January 1st, 2014, we ask that you re-schedule your appointment with our physicians should you arrive 10 or more minutes late for your appointment.  We strive to give you quality time with our providers, and arriving late affects you and other patients whose appointments are after yours.    Again, thank you for choosing [facility].  Our hope is that these requests will decrease the amount of time that you wait before being seen by our physicians.       _____________________________________________________________  Should you have questions after your visit to [facility], please contact our office at [phone] between the hours of 8:30 a.m. and 5:00 p.m.  Voicemails left after 4:30 p.m. will not be returned until the following business day.  For prescription refill requests, have your pharmacy contact our office with your prescription refill request.

## 2012-08-17 ENCOUNTER — Other Ambulatory Visit: Payer: Self-pay

## 2012-08-17 DIAGNOSIS — I739 Peripheral vascular disease, unspecified: Secondary | ICD-10-CM

## 2012-08-20 ENCOUNTER — Telehealth: Payer: Self-pay | Admitting: Family Medicine

## 2012-08-20 MED ORDER — LOVASTATIN 40 MG PO TABS: 40.0000 mg | ORAL_TABLET | Freq: Every day | ORAL | Status: DC

## 2012-08-20 NOTE — Telephone Encounter (Signed)
rx refilled.

## 2012-08-21 ENCOUNTER — Encounter: Payer: Self-pay | Admitting: Family Medicine

## 2012-08-21 ENCOUNTER — Ambulatory Visit (INDEPENDENT_AMBULATORY_CARE_PROVIDER_SITE_OTHER): Payer: Medicare Other | Admitting: Family Medicine

## 2012-08-21 VITALS — BP 140/50 | HR 74 | Temp 98.0°F | Resp 14 | Wt 164.0 lb

## 2012-08-21 DIAGNOSIS — I7090 Unspecified atherosclerosis: Secondary | ICD-10-CM

## 2012-08-21 DIAGNOSIS — N184 Chronic kidney disease, stage 4 (severe): Secondary | ICD-10-CM

## 2012-08-21 DIAGNOSIS — I2510 Atherosclerotic heart disease of native coronary artery without angina pectoris: Secondary | ICD-10-CM

## 2012-08-21 DIAGNOSIS — I10 Essential (primary) hypertension: Secondary | ICD-10-CM

## 2012-08-21 NOTE — Progress Notes (Signed)
Subjective:     Patient ID: Ricardo Schaefer, male   DOB: 1926-06-26, 79 y.o.   MRN: [id number]  HPI  Patient is here for followup of his hypertension. He is taking doxazosin 4 mg by mouth daily, nifedipine extended release 60 mg by mouth daily, atenolol 50 mg by mouth twice a day, hydralazine 25 mg by mouth twice a day.  Recent lab work to Dr. Gustavo Lah office revealed a creatinine of 1.93 which is stable and at the patient's baseline. At his last office visit due to his chronic kidney disease we discontinued the HCTZ.  Patient denies any angina. Does report his baseline dyspnea on exertion. He gets very winded with minimal activity. He smoked about half a pack a day for over 60 years. His last office visit we tried Advair 250/50 inhaled twice a day. He states this did not help his shortness of breath.  He does report occasional wheezing.  Of note there is a mass in his right inguinal crease this thought to be possibly an aneurysm proximal to his peripheral stent.  He is scheduled to get an arterial ultrasound and to see Dr. Arbie Cookey April 29.  Past Medical History  Diagnosis Date  . Coronary atherosclerosis of native coronary artery     Occluded RCA with collaterals, DES to LAD and circumflex 2005  . Essential hypertension, benign   . Non Hodgkin's lymphoma   . Chronic renal insufficiency   . Hyperlipidemia   . Degenerative disc disease, lumbar   . Duodenal ulcer     EGD 6/11 - iron deficiency anemia  . Pericardial effusion     Status post window  . Carotid artery disease     Nonobstructive, less than 50% LICA by angiography 5/12  . Stroke     Occurred 1999, had left atrial appendage thrombus at that time - previously on Coumadin  . Peripheral arterial disease   . Enlarged prostate   . Lymphoma   . Melanoma    Current Outpatient Prescriptions on File Prior to Visit  Medication Sig Dispense Refill  . ALPRAZolam (XANAX) 0.5 MG tablet Take 1 at 8 am, noon, 4 pm and hs  120 tablet  1  .  chlorpheniramine (CHLOR-TRIMETON) 4 MG tablet Take 4 mg by mouth daily as needed. For sinus/allergies      . clopidogrel (PLAVIX) 75 MG tablet Take 75 mg by mouth every morning.       Marland Kitchen doxazosin (CARDURA) 4 MG tablet Take 4 mg by mouth every morning.       Marland Kitchen HYDROcodone-acetaminophen (NORCO/VICODIN) 5-325 MG per tablet Take 1 tablet by mouth every 6 (six) hours as needed for pain.      Marland Kitchen lactose free nutrition (BOOST) LIQD Take 1 Container by mouth daily.      Marland Kitchen lovastatin (MEVACOR) 40 MG tablet Take 1 tablet (40 mg total) by mouth at bedtime.  30 tablet  3  . metoprolol (LOPRESSOR) 50 MG tablet Take 1 tablet (50 mg total) by mouth 2 (two) times daily.  60 tablet  5  . NIFEdipine (PROCARDIA XL/ADALAT-CC) 60 MG 24 hr tablet Take 60 mg by mouth every morning.       . pantoprazole (PROTONIX) 40 MG tablet Take 40 mg by mouth every morning.       . senna-docusate (SENOKOT-S) 8.6-50 MG per tablet Take 1 tablet by mouth daily.      . Tamsulosin HCl (FLOMAX) 0.4 MG CAPS Take 0.4 mg by mouth daily after supper.       Marland Kitchen  venlafaxine XR (EFFEXOR-XR) 150 MG 24 hr capsule Take 1 capsule (150 mg total) by mouth every morning.  30 capsule  5   No current facility-administered medications on file prior to visit.    Review of Systems Review of systems is otherwise negative    Objective:   Physical Exam  HENT:  Head: Normocephalic.  Right Ear: External ear normal.  Left Ear: External ear normal.  Mouth/Throat: Oropharynx is clear and moist.  Eyes: Conjunctivae are normal. Pupils are equal, round, and reactive to light.  Neck: Normal range of motion. Neck supple. Carotid bruit is present. No thyromegaly present.  Cardiovascular: Normal rate, regular rhythm and normal heart sounds.   Pulmonary/Chest: Effort normal. No respiratory distress. He has wheezes. He has no rales.  Abdominal: Soft. Bowel sounds are normal.  Lymphadenopathy:    He has no cervical adenopathy.   firm 3 x 3 centimeter mass in right  inguinal crease.    patient is a frail, cachectic-looking elderly gentleman.    Assessment:     Hypertension Chronic kidney disease ASCVD   dyspnea, multifactorial, possibly COPD Plan:     His blood pressure is well-controlled, will not change in his medications at the present time. With regards to his dyspnea on exertion, I will try symbicort 160/4.5 2 puffs inhaled twice a day. Patient to call me in one to 2 weeks and give me an update on his dyspnea on exertion and whether or not the symbicort helps.

## 2012-08-23 ENCOUNTER — Telehealth: Payer: Self-pay | Admitting: Family Medicine

## 2012-08-23 MED ORDER — CLOPIDOGREL BISULFATE 75 MG PO TABS: 75.0000 mg | ORAL_TABLET | ORAL | Status: DC

## 2012-08-23 NOTE — Telephone Encounter (Signed)
rx refilled.

## 2012-09-04 ENCOUNTER — Other Ambulatory Visit: Payer: Self-pay

## 2012-09-04 DIAGNOSIS — I739 Peripheral vascular disease, unspecified: Secondary | ICD-10-CM

## 2012-09-05 ENCOUNTER — Other Ambulatory Visit (HOSPITAL_COMMUNITY): Payer: Self-pay | Admitting: Oncology

## 2012-09-05 ENCOUNTER — Telehealth: Payer: Self-pay | Admitting: Family Medicine

## 2012-09-05 DIAGNOSIS — F410 Panic disorder [episodic paroxysmal anxiety] without agoraphobia: Secondary | ICD-10-CM

## 2012-09-05 MED ORDER — ALPRAZOLAM 0.5 MG PO TABS: ORAL_TABLET | ORAL | Status: DC

## 2012-09-05 MED ORDER — NIFEDIPINE ER OSMOTIC RELEASE 60 MG PO TB24: 60.0000 mg | ORAL_TABLET | ORAL | Status: DC

## 2012-09-05 NOTE — Telephone Encounter (Signed)
Rx Refilled  

## 2012-09-17 ENCOUNTER — Encounter: Payer: Self-pay | Admitting: Vascular Surgery

## 2012-09-18 ENCOUNTER — Ambulatory Visit (INDEPENDENT_AMBULATORY_CARE_PROVIDER_SITE_OTHER): Payer: MEDICARE | Admitting: Vascular Surgery

## 2012-09-18 ENCOUNTER — Encounter (INDEPENDENT_AMBULATORY_CARE_PROVIDER_SITE_OTHER): Payer: MEDICARE | Admitting: *Deleted

## 2012-09-18 ENCOUNTER — Encounter: Payer: Self-pay | Admitting: Vascular Surgery

## 2012-09-18 VITALS — BP 136/61 | HR 75 | Resp 16 | Ht 69.0 in | Wt 165.0 lb

## 2012-09-18 DIAGNOSIS — Z48812 Encounter for surgical aftercare following surgery on the circulatory system: Secondary | ICD-10-CM

## 2012-09-18 DIAGNOSIS — I739 Peripheral vascular disease, unspecified: Secondary | ICD-10-CM

## 2012-09-18 NOTE — Progress Notes (Addendum)
[facility] [place]  Referred by:  Donita Brooks, MD [id number] [facility] [address] [place], [place] [address]  Reason for referral: Dr. Jodene Nam  History of Present Illness  Ricardo Schaefer is a 79 y.o. ([birth year]) male who presents with chief complaint: right groin  pain.  Onset of symptom occurred 4-6 weeks ago.  Pain is described as minimal, severity 1/10, and associated with sitting.  Patient has attempted to treat this pain with changing his position frequently.  He can walk about 100-200 feet prior to needing a rest break due to right leg pain.  The patient has no rest pain symptoms and neg leg wounds/ulcers,.  Atherosclerotic risk factors include: smoking, B-cell follicular lymphoma, and essential hypertension.  Past Medical History  Diagnosis Date  . Coronary atherosclerosis of native coronary artery     Occluded RCA with collaterals, DES to LAD and circumflex 2005  . Essential hypertension, benign   . Non Hodgkin's lymphoma   . Chronic renal insufficiency   . Hyperlipidemia   . Degenerative disc disease, lumbar   . Duodenal ulcer     EGD 6/11 - iron deficiency anemia  . Pericardial effusion     Status post window  . Carotid artery disease     Nonobstructive, less than 50% LICA by angiography 5/12  . Stroke     Occurred 1999, had left atrial appendage thrombus at that time - previously on Coumadin  . Peripheral arterial disease   . Enlarged prostate   . Lymphoma   . Melanoma     Past Surgical History  Procedure Laterality Date  . Cholecystectomy    . Pericardial window    . Aortobifemoral bypass grafting  2005  . Left femoral popliteal bypass grafting  2005  . Coronary angioplasty with stent placement      History   Social History  . Marital Status: Married    Spouse Name: N/A    Number of Children: N/A  . Years of Education: N/A   Occupational History  . Retired    Social History Main Topics  . Smoking status: Current Every Day Smoker -- 0.50  packs/day for 75 years    Types: Cigarettes  . Smokeless tobacco: Never Used  . Alcohol Use: No  . Drug Use: No  . Sexually Active: Not on file   Other Topics Concern  . Not on file   Social History Narrative  . No narrative on file    Family History  Problem Relation Age of Onset  . COPD Mother   . Coronary artery disease Mother     Current Outpatient Prescriptions on File Prior to Visit  Medication Sig Dispense Refill  . ALPRAZolam (XANAX) 0.5 MG tablet Take 1 at 8 am, noon, 4 pm and hs  120 tablet  2  . chlorpheniramine (CHLOR-TRIMETON) 4 MG tablet Take 4 mg by mouth daily as needed. For sinus/allergies      . clopidogrel (PLAVIX) 75 MG tablet Take 1 tablet (75 mg total) by mouth every morning.  30 tablet  5  . doxazosin (CARDURA) 4 MG tablet Take 4 mg by mouth every morning.       . hydrALAZINE (APRESOLINE) 25 MG tablet Take 25 mg by mouth 2 (two) times daily at 10 AM and 5 PM.      . HYDROcodone-acetaminophen (NORCO/VICODIN) 5-325 MG per tablet Take 1 tablet by mouth every 6 (six) hours as needed for pain.      Marland Kitchen lactose  free nutrition (BOOST) LIQD Take 1 Container by mouth daily.      Marland Kitchen lovastatin (MEVACOR) 40 MG tablet Take 1 tablet (40 mg total) by mouth at bedtime.  30 tablet  3  . metoprolol (LOPRESSOR) 50 MG tablet Take 1 tablet (50 mg total) by mouth 2 (two) times daily.  60 tablet  5  . NIFEdipine (PROCARDIA XL/ADALAT-CC) 60 MG 24 hr tablet Take 1 tablet (60 mg total) by mouth every morning.  30 tablet  5  . pantoprazole (PROTONIX) 40 MG tablet Take 40 mg by mouth every morning.       . senna-docusate (SENOKOT-S) 8.6-50 MG per tablet Take 1 tablet by mouth daily.      . Tamsulosin HCl (FLOMAX) 0.4 MG CAPS Take 0.4 mg by mouth daily after supper.       . venlafaxine XR (EFFEXOR-XR) 150 MG 24 hr capsule Take 1 capsule (150 mg total) by mouth every morning.  30 capsule  5   No current facility-administered medications on file prior to visit.    Allergies  Allergen  Reactions  . Aspirin     Causes stomach bleeds     REVIEW OF SYSTEMS:  (Positives checked otherwise negative)  CARDIOVASCULAR:  [x ] chest pain, [ ]  chest pressure, [ ]  palpitations, [ ]  shortness of breath when laying flat, [x ] shortness of breath with exertion,   [ ]  pain in feet when walking, [ ]  pain in feet when laying flat, [ ]  history of blood clot in veins (DVT), [ ]  history of phlebitis, [ ]  swelling in legs, [ ]  varicose veins  PULMONARY:  [ ]  productive cough, [ ]  asthma, [ ]  wheezing  NEUROLOGIC:  [ ]  weakness in arms or legs, [ ]  numbness in arms or legs, [ ]  difficulty speaking or slurred speech, [ ]  temporary loss of vision in one eye, [ ]  dizziness  HEMATOLOGIC:  [ ]  bleeding problems, [ ]  problems with blood clotting too easily  MUSCULOSKEL:  [ x] joint pain, [ ]  joint swelling  GASTROINTEST:  [ ]   Vomiting blood, [ ]   Blood in stool     GENITOURINARY:  [ ]   Burning with urination, [ ]   Blood in urine  PSYCHIATRIC:  [ ]  history of major depression [x]  anxiety  INTEGUMENTARY:  [ ]  rashes, [ ]  ulcers  CONSTITUTIONAL:  [ ]  fever, [ ]  chills  Physical Examination  Filed Vitals:   09/18/12 1243  BP: 136/61  Pulse: 75  Resp: 16  Height: 5\' 9"  (1.753 m)  Weight: 165 lb (74.844 kg)  SpO2: 95%   Body mass index is 24.36 kg/(m^2).  General: A&O x 3, WDWN, neg. Cachectic / Ill appear / Somulent  Head: [place]/AT, neg. Temporalis wasting / Prominent temp pulse  Ear/Nose/Throat: Hearing grossly intact, nares w/o erythema or drainage, oropharynx w/o Erythema/Exudate neg. Hearing loss, Nasal drainage, Dental caries, OP w/ erythema / exudate  Eyes: PERRLA, EOMI  Neck: Supple, no nuchal rigidity  Pulmonary: Sym exp, good air movt, CTAB, no rales, rhonchi, & wheezing  Cardiac: RRR  Vascular: Vessel Right Left  Radial Palpable Palpable  Ulnar Palpable Palpable  Brachial Palpable Palpable  Carotid Palpable, without bruit Palpable, without bruit  Aorta Not  palpable N/A  Femoral Palpable Palpable  Popliteal Not palpable Not palpable  PT not Palpable  Palpable  DP not Palpable  Palpable   Gastrointestinal: soft, NTN CVAT , + AAA / Surg.   Musculoskeletal: M/S 5/5 throughout  left except right 4+/5, Extremities without ischemic changes.  Neurologic: CN 2-12 intact , Motor exam as listed above  Psychiatric: Judgment intact, Mood & affect appropriatefor   Dermatologic: See M/S exam for extremity exam, no rashes otherwise noted  Lymph : No Cervical, Axillary, or Positive right Inguinal lymphadenopathy    Non-Invasive Vascular Imaging  ABI (Date: 09/18/2012)  RLE: 0.45  LLE: 0.97   Outside Studies/Documentation 12 pages of outside documents were reviewed including: labs, previous ABI's.  Medical Decision Making  Ricardo Schaefer is a 79 y.o. male who presents with: Right groin lymph node and stable intermittent claudication.  His ABI's have been stable without change in the previous year.  The previous ABI's were done at [facility] 10/08/2011.  Right 0.41 and L 0.92.   We recommend he continue with activity as tolerates.    Thank you for allowing [place] to participate in this patient's care.  He will call us if he has increased leg pain, non healing ulcers or pallor.  We recommend frequent skin checks of his right foot daily.  Follow up PRN  Mosetta Pigeon PA-C [facility] [place] Office: [phone]   09/18/2012, 1:21 PM  I have examined the patient, reviewed and agree with above. With patient and family present. No evidence of femoral false aneurysm on the right. He does have stable lower surety arterial insufficiency. The main issue seems to be more in his degenerative disc disease with discogenic back pain going down into his right leg. I reviewed symptoms of critical limb ischemia needle notify should this occur. Otherwise he'll see [place] on an as-needed basis  EARLY, TODD, MD 09/18/2012 2:23 PM

## 2012-09-20 ENCOUNTER — Encounter: Payer: MEDICARE | Admitting: Vascular Surgery

## 2012-10-05 ENCOUNTER — Other Ambulatory Visit: Payer: Self-pay | Admitting: Family Medicine

## 2012-10-05 MED ORDER — TAMSULOSIN HCL 0.4 MG PO CAPS: 0.4000 mg | ORAL_CAPSULE | ORAL | Status: DC

## 2012-10-05 NOTE — Telephone Encounter (Signed)
Rx Refilled  

## 2012-10-21 ENCOUNTER — Other Ambulatory Visit: Payer: Self-pay | Admitting: Family Medicine

## 2012-11-14 ENCOUNTER — Ambulatory Visit (HOSPITAL_COMMUNITY): Payer: MEDICARE | Admitting: Oncology

## 2012-11-14 ENCOUNTER — Encounter (HOSPITAL_COMMUNITY): Payer: MEDICARE | Attending: Oncology

## 2012-11-14 DIAGNOSIS — C8299 Follicular lymphoma, unspecified, extranodal and solid organ sites: Secondary | ICD-10-CM

## 2012-11-14 DIAGNOSIS — I739 Peripheral vascular disease, unspecified: Secondary | ICD-10-CM | POA: Insufficient documentation

## 2012-11-14 DIAGNOSIS — C8290 Follicular lymphoma, unspecified, unspecified site: Secondary | ICD-10-CM

## 2012-11-14 LAB — SEDIMENTATION RATE: Sed Rate: 8 mm/hr (ref 0–16)

## 2012-11-14 LAB — CBC WITH DIFFERENTIAL/PLATELET
Basophils Absolute: 0 10*3/uL (ref 0.0–0.1)
Basophils Relative: 1 % (ref 0–1)
Eosinophils Relative: 4 % (ref 0–5)
HCT: 39.1 % (ref 39.0–52.0)
Hemoglobin: 12.8 g/dL — ABNORMAL LOW (ref 13.0–17.0)
MCH: 30.1 pg (ref 26.0–34.0)
MCHC: 32.7 g/dL (ref 30.0–36.0)
MCV: 92 fL (ref 78.0–100.0)
Monocytes Absolute: 1 10*3/uL (ref 0.1–1.0)
Monocytes Relative: 13 % — ABNORMAL HIGH (ref 3–12)
Neutro Abs: 4.5 10*3/uL (ref 1.7–7.7)
RDW: 13.7 % (ref 11.5–15.5)

## 2012-11-14 LAB — COMPREHENSIVE METABOLIC PANEL
AST: 22 U/L (ref 0–37)
Albumin: 3.4 g/dL — ABNORMAL LOW (ref 3.5–5.2)
BUN: 26 mg/dL — ABNORMAL HIGH (ref 6–23)
Calcium: 10.4 mg/dL (ref 8.4–10.5)
Chloride: 102 mEq/L (ref 96–112)
Creatinine, Ser: 1.94 mg/dL — ABNORMAL HIGH (ref 0.50–1.35)
GFR calc non Af Amer: 30 mL/min — ABNORMAL LOW (ref >90)
Total Bilirubin: 0.2 mg/dL — ABNORMAL LOW (ref 0.3–1.2)

## 2012-11-14 LAB — LACTATE DEHYDROGENASE: LDH: 168 U/L (ref 94–250)

## 2012-11-14 NOTE — Progress Notes (Signed)
Labs drawn today for cbc/diff,cmp,ldh,sed rate 

## 2012-11-16 ENCOUNTER — Encounter (HOSPITAL_COMMUNITY): Payer: Self-pay | Admitting: Oncology

## 2012-11-16 ENCOUNTER — Encounter (HOSPITAL_BASED_OUTPATIENT_CLINIC_OR_DEPARTMENT_OTHER): Payer: MEDICARE | Admitting: Oncology

## 2012-11-16 VITALS — BP 134/53 | HR 69 | Temp 97.5°F | Resp 18 | Wt 167.6 lb

## 2012-11-16 DIAGNOSIS — C8299 Follicular lymphoma, unspecified, extranodal and solid organ sites: Secondary | ICD-10-CM

## 2012-11-16 DIAGNOSIS — C8290 Follicular lymphoma, unspecified, unspecified site: Secondary | ICD-10-CM

## 2012-11-16 NOTE — Patient Instructions (Addendum)
.  [facility] Discharge Instructions  RECOMMENDATIONS MADE BY THE CONSULTANT AND ANY TEST RESULTS WILL BE SENT TO YOUR REFERRING PHYSICIAN.  EXAM FINDINGS BY THE PHYSICIAN TODAY AND SIGNS OR SYMPTOMS TO REPORT TO CLINIC OR PRIMARY PHYSICIAN:  EXam good today   SPECIAL INSTRUCTIONS/FOLLOW-UP: See Tom in 3 months  Labs in 3 months before visit  Thank you for choosing [facility] to provide your oncology and hematology care.  To afford each patient quality time with our providers, please arrive at least 15 minutes before your scheduled appointment time.  With your help, our goal is to use those 15 minutes to complete the necessary work-up to ensure our physicians have the information they need to help with your evaluation and healthcare recommendations.    Effective January 1st, 2014, we ask that you re-schedule your appointment with our physicians should you arrive 10 or more minutes late for your appointment.  We strive to give you quality time with our providers, and arriving late affects you and other patients whose appointments are after yours.    Again, thank you for choosing [facility].  Our hope is that these requests will decrease the amount of time that you wait before being seen by our physicians.       _____________________________________________________________  Should you have questions after your visit to [facility], please contact our office at [phone] between the hours of 8:30 a.m. and 5:00 p.m.  Voicemails left after 4:30 p.m. will not be returned until the following business day.  For prescription refill requests, have your pharmacy contact our office with your prescription refill request.

## 2012-11-16 NOTE — Progress Notes (Signed)
Low grade B cell follicular lymphoma not in need of RX.  He also has peripheral vascular disease which is not in need of surgery now according to his vascular surgeon.  His counts and LDH are very stable and he has only a new 1.5 cm node that is new in Left posterior low cervical chain.  He remains without B symptoms and nothing else positive on ROS  He will return in a few months for routine f/u

## 2012-12-04 ENCOUNTER — Other Ambulatory Visit (HOSPITAL_COMMUNITY): Payer: Self-pay | Admitting: Oncology

## 2012-12-04 DIAGNOSIS — F410 Panic disorder [episodic paroxysmal anxiety] without agoraphobia: Secondary | ICD-10-CM

## 2012-12-04 MED ORDER — ALPRAZOLAM 0.5 MG PO TABS: ORAL_TABLET | ORAL | Status: DC

## 2012-12-20 ENCOUNTER — Other Ambulatory Visit: Payer: Self-pay | Admitting: Family Medicine

## 2013-02-05 ENCOUNTER — Other Ambulatory Visit: Payer: Self-pay | Admitting: Family Medicine

## 2013-02-05 ENCOUNTER — Encounter: Payer: Self-pay | Admitting: Family Medicine

## 2013-02-05 NOTE — Telephone Encounter (Signed)
Medication refill for one time only.  Patient needs to be seen.  Letter sent for patient to call and schedule 

## 2013-02-10 ENCOUNTER — Other Ambulatory Visit (HOSPITAL_COMMUNITY): Payer: Self-pay | Admitting: Hematology and Oncology

## 2013-02-10 DIAGNOSIS — C8290 Follicular lymphoma, unspecified, unspecified site: Secondary | ICD-10-CM

## 2013-02-11 ENCOUNTER — Other Ambulatory Visit (HOSPITAL_COMMUNITY): Payer: Self-pay | Admitting: Hematology and Oncology

## 2013-02-15 ENCOUNTER — Encounter (HOSPITAL_COMMUNITY): Payer: MEDICARE | Attending: Hematology and Oncology

## 2013-02-15 ENCOUNTER — Other Ambulatory Visit (HOSPITAL_COMMUNITY): Payer: MEDICARE

## 2013-02-15 DIAGNOSIS — C8290 Follicular lymphoma, unspecified, unspecified site: Secondary | ICD-10-CM

## 2013-02-15 DIAGNOSIS — C8299 Follicular lymphoma, unspecified, extranodal and solid organ sites: Secondary | ICD-10-CM | POA: Insufficient documentation

## 2013-02-15 LAB — COMPREHENSIVE METABOLIC PANEL
ALT: 11 U/L (ref 0–53)
Albumin: 3.5 g/dL (ref 3.5–5.2)
Alkaline Phosphatase: 81 U/L (ref 39–117)
BUN: 36 mg/dL — ABNORMAL HIGH (ref 6–23)
Calcium: 10.5 mg/dL (ref 8.4–10.5)
Creatinine, Ser: 2.14 mg/dL — ABNORMAL HIGH (ref 0.50–1.35)
GFR calc Af Amer: 31 mL/min — ABNORMAL LOW (ref >90)
Glucose, Bld: 92 mg/dL (ref 70–99)
Sodium: 140 mEq/L (ref 135–145)
Total Protein: 6.5 g/dL (ref 6.0–8.3)

## 2013-02-15 LAB — CBC WITH DIFFERENTIAL/PLATELET
Basophils Absolute: 0.1 10*3/uL (ref 0.0–0.1)
Eosinophils Relative: 4 % (ref 0–5)
Hemoglobin: 12.9 g/dL — ABNORMAL LOW (ref 13.0–17.0)
Lymphocytes Relative: 24 % (ref 12–46)
MCV: 93.8 fL (ref 78.0–100.0)
Monocytes Relative: 10 % (ref 3–12)
Neutro Abs: 4 10*3/uL (ref 1.7–7.7)
Platelets: 147 10*3/uL — ABNORMAL LOW (ref 150–400)
RDW: 13.9 % (ref 11.5–15.5)
WBC: 6.6 10*3/uL (ref 4.0–10.5)

## 2013-02-15 LAB — LACTATE DEHYDROGENASE: LDH: 169 U/L (ref 94–250)

## 2013-02-15 LAB — RETICULOCYTES: Retic Ct Pct: 1.3 % (ref 0.4–3.1)

## 2013-02-15 LAB — FERRITIN: Ferritin: 120 ng/mL (ref 22–322)

## 2013-02-15 NOTE — Progress Notes (Signed)
Labs drawn today for cbc/diff,retic,cmp,ldh,fer,beta 2 micro

## 2013-02-18 LAB — BETA 2 MICROGLOBULIN, SERUM: Beta-2 Microglobulin: 8.26 mg/L — ABNORMAL HIGH (ref 1.01–1.73)

## 2013-02-19 ENCOUNTER — Encounter (HOSPITAL_COMMUNITY): Payer: Self-pay

## 2013-02-19 ENCOUNTER — Encounter (HOSPITAL_BASED_OUTPATIENT_CLINIC_OR_DEPARTMENT_OTHER): Payer: MEDICARE

## 2013-02-19 VITALS — BP 119/51 | HR 76 | Resp 18 | Wt 156.6 lb

## 2013-02-19 DIAGNOSIS — I739 Peripheral vascular disease, unspecified: Secondary | ICD-10-CM

## 2013-02-19 DIAGNOSIS — C8290 Follicular lymphoma, unspecified, unspecified site: Secondary | ICD-10-CM

## 2013-02-19 DIAGNOSIS — C8299 Follicular lymphoma, unspecified, extranodal and solid organ sites: Secondary | ICD-10-CM

## 2013-02-19 NOTE — Patient Instructions (Addendum)
.  [facility] Discharge Instructions  RECOMMENDATIONS MADE BY THE CONSULTANT AND ANY TEST RESULTS WILL BE SENT TO YOUR REFERRING PHYSICIAN.  EXAM FINDINGS BY THE PHYSICIAN TODAY AND SIGNS OR SYMPTOMS TO REPORT TO CLINIC OR PRIMARY PHYSICIAN: Exam and findings as discussed by Dr. Zigmund Daniel.  INSTRUCTIONS/FOLLOW-UP: 3 months with labs then to see MD  Thank you for choosing [facility] to provide your oncology and hematology care.  To afford each patient quality time with our providers, please arrive at least 15 minutes before your scheduled appointment time.  With your help, our goal is to use those 15 minutes to complete the necessary work-up to ensure our physicians have the information they need to help with your evaluation and healthcare recommendations.    Effective January 1st, 2014, we ask that you re-schedule your appointment with our physicians should you arrive 10 or more minutes late for your appointment.  We strive to give you quality time with our providers, and arriving late affects you and other patients whose appointments are after yours.    Again, thank you for choosing [facility].  Our hope is that these requests will decrease the amount of time that you wait before being seen by our physicians.       _____________________________________________________________  Should you have questions after your visit to [facility], please contact our office at [phone] between the hours of 8:30 a.m. and 5:00 p.m.  Voicemails left after 4:30 p.m. will not be returned until the following business day.  For prescription refill requests, have your pharmacy contact our office with your prescription refill request.

## 2013-02-19 NOTE — Progress Notes (Signed)
[facility] OFFICE PROGRESS NOTE  Leo Grosser, MD [address] [place] [address]  DIAGNOSIS: Low-Grade, B-Cell, Follicular lymphoma - Plan: CBC with Differential, Reticulocytes, Comprehensive metabolic panel, Lactate dehydrogenase, Ferritin  Chief Complaint  Patient presents with  . Lymphoma    CURRENT THERAPY: Watchful expectation j INTERVAL HISTORY: Ricardo Schaefer 79 y.o. male returns for followup of low-grade B-cell follicular lymphoma diagnosed by inguinal lymph node biopsy. So having problems with balance due to circulation involving the left upper extremity and right lower extremity. He was recently reevaluated for possible recurrent aneurysm in the right inguinal region which was negative. He has not noticed any significant new lymphadenopathy and denies any fever, night sweats, melena, hematochezia, hematuria, lower extremity swelling or redness, skin rash, joint pain, headache, or seizures.   MEDICAL HISTORY: Past Medical History  Diagnosis Date  . Coronary atherosclerosis of native coronary artery     Occluded RCA with collaterals, DES to LAD and circumflex 2005  . Essential hypertension, benign   . Non Hodgkin's lymphoma   . Chronic renal insufficiency   . Hyperlipidemia   . Degenerative disc disease, lumbar   . Duodenal ulcer     EGD 6/11 - iron deficiency anemia  . Pericardial effusion     Status post window  . Carotid artery disease     Nonobstructive, less than 50% LICA by angiography 5/12  . Stroke     Occurred 1999, had left atrial appendage thrombus at that time - previously on Coumadin  . Peripheral arterial disease   . Enlarged prostate   . Lymphoma   . Melanoma     INTERIM HISTORY: has Coronary atherosclerosis of native coronary artery; Mixed hyperlipidemia; Essential hypertension, benign; Carotid artery disease; Peripheral arterial disease; Chest pain at rest; Chronic kidney disease, stage III (moderate); Low-Grade,  B-Cell, Follicular lymphoma; and Peripheral vascular disease, unspecified on his problem list.    ALLERGIES:  is allergic to aspirin.  MEDICATIONS: has a current medication list which includes the following prescription(s): alprazolam, chlorpheniramine, clopidogrel, doxazosin, hydralazine, hydrocodone-acetaminophen, lactose free nutrition, lovastatin, metoprolol, nifedipine, pantoprazole, senna-docusate, tamsulosin, and venlafaxine xr.  SURGICAL HISTORY:  Past Surgical History  Procedure Laterality Date  . Cholecystectomy    . Pericardial window    . Aortobifemoral bypass grafting  2005  . Left femoral popliteal bypass grafting  2005  . Coronary angioplasty with stent placement      FAMILY HISTORY: family history includes COPD in his mother; Coronary artery disease in his mother.  SOCIAL HISTORY:  reports that he has been smoking Cigarettes.  He has a 37.5 pack-year smoking history. He has never used smokeless tobacco. He reports that he does not drink alcohol or use illicit drugs.  REVIEW OF SYSTEMS:  Other than that discussed above is noncontributory.  PHYSICAL EXAMINATION: ECOG PERFORMANCE STATUS: 1 - Symptomatic but completely ambulatory  Blood pressure 119/51, pulse 76, resp. rate 18, weight 156 lb 9 oz (71.016 kg).  GENERAL:alert, no distress and comfortable SKIN: skin color, texture, turgor are normal, no rashes or significant lesions EYES: normal, Conjunctiva are pink and non-injected, sclera clear OROPHARYNX:no exudate, no erythema and lips, buccal mucosa, and tongue normal  NECK: supple, thyroid normal size, non-tender, without nodularity CHEST: Slightly increased AP diameter without gynecomastia LYMPH: Left posterior low cervical lymph node is palpable and unchanged from previous exam. Right and left inguinal area show evidence of previous surgery without discrete adenopathy but with surgical scars. l LUNGS: clear to auscultation  and percussion with normal breathing  effort HEART: regular rate & rhythm and no murmurs and no lower extremity edema ABDOMEN:abdomen soft, non-tender and normal bowel sounds Musculoskeletal:no cyanosis of digits and no clubbing  NEURO: alert & oriented x 3 with fluent speech, no focal motor/sensory deficits   LABORATORY DATA: Infusion on 02/15/2013  Component Date Value Range Status  . WBC 02/15/2013 6.6  4.0 - 10.5 K/uL Final  . RBC 02/15/2013 4.32  4.22 - 5.81 MIL/uL Final  . Hemoglobin 02/15/2013 12.9* 13.0 - 17.0 g/dL Final  . HCT 16/02/9603 40.5  39.0 - 52.0 % Final  . MCV 02/15/2013 93.8  78.0 - 100.0 fL Final  . MCH 02/15/2013 29.9  26.0 - 34.0 pg Final  . MCHC 02/15/2013 31.9  30.0 - 36.0 g/dL Final  . RDW 54/01/8118 13.9  11.5 - 15.5 % Final  . Platelets 02/15/2013 147* 150 - 400 K/uL Final  . Neutrophils Relative % 02/15/2013 61  43 - 77 % Final  . Neutro Abs 02/15/2013 4.0  1.7 - 7.7 K/uL Final  . Lymphocytes Relative 02/15/2013 24  12 - 46 % Final  . Lymphs Abs 02/15/2013 1.6  0.7 - 4.0 K/uL Final  . Monocytes Relative 02/15/2013 10  3 - 12 % Final  . Monocytes Absolute 02/15/2013 0.7  0.1 - 1.0 K/uL Final  . Eosinophils Relative 02/15/2013 4  0 - 5 % Final  . Eosinophils Absolute 02/15/2013 0.3  0.0 - 0.7 K/uL Final  . Basophils Relative 02/15/2013 1  0 - 1 % Final  . Basophils Absolute 02/15/2013 0.1  0.0 - 0.1 K/uL Final  . Retic Ct Pct 02/15/2013 1.3  0.4 - 3.1 % Final  . RBC. 02/15/2013 4.32  4.22 - 5.81 MIL/uL Final  . Retic Count, Manual 02/15/2013 56.2  19.0 - 186.0 K/uL Final  . Sodium 02/15/2013 140  135 - 145 mEq/L Final  . Potassium 02/15/2013 4.4  3.5 - 5.1 mEq/L Final  . Chloride 02/15/2013 102  96 - 112 mEq/L Final  . CO2 02/15/2013 31  19 - 32 mEq/L Final  . Glucose, Bld 02/15/2013 92  70 - 99 mg/dL Final  . BUN 14/78/2956 36* 6 - 23 mg/dL Final  . Creatinine, Ser 02/15/2013 2.14* 0.50 - 1.35 mg/dL Final  . Calcium 21/30/8657 10.5  8.4 - 10.5 mg/dL Final  . Total Protein 02/15/2013 6.5   6.0 - 8.3 g/dL Final  . Albumin 84/69/6295 3.5  3.5 - 5.2 g/dL Final  . AST 28/41/3244 22  0 - 37 U/L Final  . ALT 02/15/2013 11  0 - 53 U/L Final  . Alkaline Phosphatase 02/15/2013 81  39 - 117 U/L Final  . Total Bilirubin 02/15/2013 0.2* 0.3 - 1.2 mg/dL Final  . GFR calc non Af Amer 02/15/2013 27* >90 mL/min Final  . GFR calc Af Amer 02/15/2013 31* >90 mL/min Final   Comment: (NOTE)                          The eGFR has been calculated using the CKD EPI equation.                          This calculation has not been validated in all clinical situations.                          eGFR's persistently <90 mL/min signify  possible Chronic Kidney                          Disease.  Marland Kitchen LDH 02/15/2013 169  94 - 250 U/L Final  . Ferritin 02/15/2013 120  22 - 322 ng/mL Final   Performed at [facility]  . Beta-2 Microglobulin 02/15/2013 8.26* 1.01 - 1.73 mg/L Final   Performed at [facility]     Urinalysis    Component Value Date/Time   COLORURINE YELLOW 11/21/2011 1519   APPEARANCEUR CLEAR 11/21/2011 1519   LABSPEC 1.010 11/21/2011 1519   PHURINE 6.0 11/21/2011 1519   GLUCOSEU NEGATIVE 11/21/2011 1519   HGBUR NEGATIVE 11/21/2011 1519   BILIRUBINUR NEGATIVE 11/21/2011 1519   KETONESUR NEGATIVE 11/21/2011 1519   PROTEINUR NEGATIVE 11/21/2011 1519   UROBILINOGEN 0.2 11/21/2011 1519   NITRITE NEGATIVE 11/21/2011 1519   LEUKOCYTESUR NEGATIVE 11/21/2011 1519    RADIOGRAPHIC STUDIES: No results found.  ASSESSMENT: #1 low-grade B-cell follicular lymphoma, stable. #2. Peripheral vascular disease. #3. Gait disturbance due to peripheral vascular disease.   PLAN: #1. Continue watchful expectation. #2. Followup in 3 months with labs.   All questions were answered. The patient knows to call the clinic with any problems, questions or concerns. We can certainly see the patient much sooner if necessary.   I spent 30 minutes counseling the patient face to face. The total time spent in the  appointment was 25 minutes.    Maurilio Lovely, MD 02/19/2013 10:08 AM

## 2013-02-20 ENCOUNTER — Other Ambulatory Visit: Payer: Self-pay | Admitting: Family Medicine

## 2013-02-25 ENCOUNTER — Encounter: Payer: Self-pay | Admitting: Family Medicine

## 2013-02-25 ENCOUNTER — Ambulatory Visit (INDEPENDENT_AMBULATORY_CARE_PROVIDER_SITE_OTHER): Payer: Medicare Other | Admitting: Family Medicine

## 2013-02-25 VITALS — BP 146/50 | HR 78 | Temp 98.1°F | Resp 14 | Ht 69.0 in | Wt 167.0 lb

## 2013-02-25 DIAGNOSIS — E785 Hyperlipidemia, unspecified: Secondary | ICD-10-CM

## 2013-02-25 DIAGNOSIS — I10 Essential (primary) hypertension: Secondary | ICD-10-CM

## 2013-02-25 DIAGNOSIS — I739 Peripheral vascular disease, unspecified: Secondary | ICD-10-CM

## 2013-02-25 NOTE — Progress Notes (Signed)
Subjective:    Patient ID: Ricardo Schaefer, male    DOB: 1927/03/06, 79 y.o.   MRN: [id number]  HPI Patient is a very pleasant 79 year old Caucasian male here today for followup of his medical problems. History of coronary artery disease and peripheral vascular disease. History of hypertension and hyperlipidemia. He has a history of stage IV chronic kidney disease. He is here today for followup of these issues. Has a low-grade B-cell non-Hodgkin's lymphoma is currently being monitored clinically. He was recently seen his cancer physician. I reviewed his lab work from that office visit in September. His creatinine is stable. The remainder of his lab work is unremarkable. He is overdue for fasting lipid panel. He denies any chest pain, shortness of breath, dyspnea on exertion. He does have symptoms of claudication with ambulation he states that he is able to walk further now than he was this time last year.  He is overdue for his flu shot. He denies any melena or hematochezia. He has no significant anemia on blood thinners. His anxiety is well controlled on Xanax 0.5 mg by mouth 4 times a day and venlafaxine 150 mg by mouth daily. Past Medical History  Diagnosis Date  . Coronary atherosclerosis of native coronary artery     Occluded RCA with collaterals, DES to LAD and circumflex 2005  . Essential hypertension, benign   . Non Hodgkin's lymphoma   . Chronic renal insufficiency   . Hyperlipidemia   . Degenerative disc disease, lumbar   . Duodenal ulcer     EGD 6/11 - iron deficiency anemia  . Pericardial effusion     Status post window  . Carotid artery disease     Nonobstructive, less than 50% LICA by angiography 5/12  . Stroke     Occurred 1999, had left atrial appendage thrombus at that time - previously on Coumadin  . Peripheral arterial disease   . Enlarged prostate   . Lymphoma   . Melanoma    Past Surgical History  Procedure Laterality Date  . Cholecystectomy    . Pericardial window     . Aortobifemoral bypass grafting  2005  . Left femoral popliteal bypass grafting  2005  . Coronary angioplasty with stent placement     Current Outpatient Prescriptions on File Prior to Visit  Medication Sig Dispense Refill  . ALPRAZolam (XANAX) 0.5 MG tablet Take 1 at 8 am, noon, 4 pm and hs  120 tablet  2  . clopidogrel (PLAVIX) 75 MG tablet TAKE ONE TABLET BY MOUTH EVERY MORNING.  30 tablet  5  . doxazosin (CARDURA) 4 MG tablet Take 4 mg by mouth every morning.       . hydrALAZINE (APRESOLINE) 25 MG tablet TAKE ONE TABLET BY MOUTH TWICE DAILY.  60 tablet  5  . HYDROcodone-acetaminophen (NORCO/VICODIN) 5-325 MG per tablet Take 1 tablet by mouth every 6 (six) hours as needed for pain.      Marland Kitchen lactose free nutrition (BOOST) LIQD Take 1 Container by mouth daily.      Marland Kitchen lovastatin (MEVACOR) 40 MG tablet TAKE ONE TABLET BY MOUTH AT BEDTIME.  30 tablet  5  . metoprolol (LOPRESSOR) 50 MG tablet TAKE ONE TABLET BY MOUTH TWICE DAILY.  60 tablet  0  . NIFEdipine (PROCARDIA XL/ADALAT-CC) 60 MG 24 hr tablet Take 1 tablet (60 mg total) by mouth every morning.  30 tablet  5  . pantoprazole (PROTONIX) 40 MG tablet Take 40 mg by mouth every morning.       Marland Kitchen  senna-docusate (SENOKOT-S) 8.6-50 MG per tablet Take 1 tablet by mouth daily.      . tamsulosin (FLOMAX) 0.4 MG CAPS Take 1 capsule (0.4 mg total) by mouth daily after supper.  30 capsule  5  . venlafaxine XR (EFFEXOR-XR) 150 MG 24 hr capsule TAKE ONE CAPSULE BY MOUTH ONCE EVERY MORNING.  30 capsule  0   No current facility-administered medications on file prior to visit.   Allergies  Allergen Reactions  . Aspirin     Causes stomach bleeds   History   Social History  . Marital Status: Married    Spouse Name: N/A    Number of Children: N/A  . Years of Education: N/A   Occupational History  . Retired    Social History Main Topics  . Smoking status: Current Every Day Smoker -- 0.50 packs/day for 75 years    Types: Cigarettes  . Smokeless  tobacco: Never Used  . Alcohol Use: No  . Drug Use: No  . Sexual Activity: Not on file   Other Topics Concern  . Not on file   Social History Narrative  . No narrative on file      Review of Systems  All other systems reviewed and are negative.       Objective:   Physical Exam  Vitals reviewed. Constitutional: He appears well-developed and well-nourished.  Neck: Neck supple. No JVD present. No thyromegaly present.  Cardiovascular: Normal rate, regular rhythm and normal heart sounds.  Exam reveals no gallop and no friction rub.   No murmur heard. Pulmonary/Chest: Effort normal and breath sounds normal. No respiratory distress. He has no wheezes. He has no rales. He exhibits no tenderness.  Abdominal: Soft. Bowel sounds are normal. He exhibits no distension and no mass. There is no tenderness. There is no rebound and no guarding.  Musculoskeletal: He exhibits no edema.  Lymphadenopathy:    He has no cervical adenopathy.  Skin: Skin is warm. No rash noted. No erythema. No pallor.          Assessment & Plan:  1. HTN (hypertension) Given his age and his chronic kidney disease I am satisfied with his blood pressure today. He does report some gait imbalance and therefore not going to try to treat his blood pressure any more stringently. His most recent metabolic panel was normal less than one month ago and I would not repeat that at this time.  2. PVD (peripheral vascular disease) I will asked the patient return fasting for a fasting lipid panel. His goal LDL is less than 70. Continue Plavix. 3. HLD (hyperlipidemia) See  problem #2.

## 2013-03-06 ENCOUNTER — Other Ambulatory Visit (HOSPITAL_COMMUNITY): Payer: Self-pay | Admitting: Oncology

## 2013-03-06 ENCOUNTER — Other Ambulatory Visit: Payer: Self-pay | Admitting: Family Medicine

## 2013-03-06 DIAGNOSIS — F410 Panic disorder [episodic paroxysmal anxiety] without agoraphobia: Secondary | ICD-10-CM

## 2013-03-06 MED ORDER — ALPRAZOLAM 0.5 MG PO TABS: ORAL_TABLET | ORAL | Status: DC

## 2013-03-14 ENCOUNTER — Other Ambulatory Visit: Payer: Self-pay | Admitting: Family Medicine

## 2013-03-15 ENCOUNTER — Other Ambulatory Visit: Payer: Medicare Other

## 2013-03-15 DIAGNOSIS — E785 Hyperlipidemia, unspecified: Secondary | ICD-10-CM

## 2013-03-15 LAB — LIPID PANEL
Cholesterol: 125 mg/dL (ref 0–200)
HDL: 26 mg/dL — ABNORMAL LOW (ref >39)
Triglycerides: 164 mg/dL — ABNORMAL HIGH (ref <150)
VLDL: 33 mg/dL (ref 0–40)

## 2013-03-18 ENCOUNTER — Encounter: Payer: Self-pay | Admitting: Family Medicine

## 2013-04-09 ENCOUNTER — Other Ambulatory Visit: Payer: Self-pay | Admitting: Family Medicine

## 2013-04-15 ENCOUNTER — Other Ambulatory Visit: Payer: Self-pay | Admitting: Family Medicine

## 2013-05-13 ENCOUNTER — Other Ambulatory Visit: Payer: Self-pay | Admitting: Family Medicine

## 2013-05-13 NOTE — Telephone Encounter (Signed)
Medication refilled per protocol. 

## 2013-05-20 ENCOUNTER — Encounter (HOSPITAL_COMMUNITY): Payer: MEDICARE | Attending: Hematology and Oncology

## 2013-05-20 DIAGNOSIS — C8290 Follicular lymphoma, unspecified, unspecified site: Secondary | ICD-10-CM

## 2013-05-20 DIAGNOSIS — C8295 Follicular lymphoma, unspecified, lymph nodes of inguinal region and lower limb: Secondary | ICD-10-CM | POA: Diagnosis present

## 2013-05-20 DIAGNOSIS — C8299 Follicular lymphoma, unspecified, extranodal and solid organ sites: Secondary | ICD-10-CM

## 2013-05-20 LAB — CBC WITH DIFFERENTIAL/PLATELET
Basophils Absolute: 0.1 10*3/uL (ref 0.0–0.1)
Basophils Relative: 1 % (ref 0–1)
Eosinophils Absolute: 0.3 10*3/uL (ref 0.0–0.7)
Eosinophils Relative: 3 % (ref 0–5)
HCT: 41.5 % (ref 39.0–52.0)
Hemoglobin: 13.6 g/dL (ref 13.0–17.0)
Lymphs Abs: 1.5 10*3/uL (ref 0.7–4.0)
MCH: 30.3 pg (ref 26.0–34.0)
MCHC: 32.8 g/dL (ref 30.0–36.0)
MCV: 92.4 fL (ref 78.0–100.0)
Monocytes Absolute: 0.6 10*3/uL (ref 0.1–1.0)
Monocytes Relative: 8 % (ref 3–12)
Neutro Abs: 4.9 10*3/uL (ref 1.7–7.7)
Neutrophils Relative %: 67 % (ref 43–77)
RBC: 4.49 MIL/uL (ref 4.22–5.81)
RDW: 13.7 % (ref 11.5–15.5)

## 2013-05-20 LAB — RETICULOCYTES: Retic Count, Absolute: 53.9 10*3/uL (ref 19.0–186.0)

## 2013-05-20 LAB — COMPREHENSIVE METABOLIC PANEL
Albumin: 3.5 g/dL (ref 3.5–5.2)
Alkaline Phosphatase: 90 U/L (ref 39–117)
BUN: 34 mg/dL — ABNORMAL HIGH (ref 6–23)
Chloride: 103 mEq/L (ref 96–112)
Creatinine, Ser: 2.21 mg/dL — ABNORMAL HIGH (ref 0.50–1.35)
GFR calc Af Amer: 30 mL/min — ABNORMAL LOW (ref >90)
Glucose, Bld: 122 mg/dL — ABNORMAL HIGH (ref 70–99)
Potassium: 3.7 mEq/L (ref 3.5–5.1)
Total Bilirubin: 0.2 mg/dL — ABNORMAL LOW (ref 0.3–1.2)
Total Protein: 6.7 g/dL (ref 6.0–8.3)

## 2013-05-20 NOTE — Progress Notes (Signed)
Labs drawn today for cbc/diff,b61mic,cmp,ret.ldh

## 2013-05-21 LAB — BETA 2 MICROGLOBULIN, SERUM: Beta-2 Microglobulin: 7.55 mg/L — ABNORMAL HIGH (ref 1.01–1.73)

## 2013-05-22 ENCOUNTER — Encounter (HOSPITAL_BASED_OUTPATIENT_CLINIC_OR_DEPARTMENT_OTHER): Payer: MEDICARE

## 2013-05-22 ENCOUNTER — Encounter (HOSPITAL_COMMUNITY): Payer: Self-pay

## 2013-05-22 VITALS — BP 186/69 | HR 68 | Temp 97.5°F | Resp 20 | Wt 162.8 lb

## 2013-05-22 DIAGNOSIS — C8290 Follicular lymphoma, unspecified, unspecified site: Secondary | ICD-10-CM

## 2013-05-22 DIAGNOSIS — C8299 Follicular lymphoma, unspecified, extranodal and solid organ sites: Secondary | ICD-10-CM

## 2013-05-22 MED ORDER — CELECOXIB 200 MG PO CAPS: 200.0000 mg | ORAL_CAPSULE | Freq: Two times a day (BID) | ORAL | Status: DC

## 2013-05-22 MED ORDER — CELECOXIB 100 MG PO CAPS: 100.0000 mg | ORAL_CAPSULE | Freq: Two times a day (BID) | ORAL | Status: DC

## 2013-05-22 NOTE — Progress Notes (Signed)
[facility]  OFFICE PROGRESS NOTE  Leo Grosser, MD [address] [place] [address]  DIAGNOSIS: Follicular lymphoma  Chief Complaint  Patient presents with  . Lymphoma    CURRENT THERAPY: Watchful expectation.  INTERVAL HISTORY: Ricardo Schaefer 79 y.o. male returns for followup of low-grade B-cell follicular lymphoma diagnosed by inguinal lymph node biopsy, not having received any therapy.  Diagnosis made about 8 or 9 years ago. His primary problem is joint pain medically both knees and hips. He uses a cane. Appetite has been good with no nausea, vomiting, melena, hematochezia, hematuria, but with lower extremity swelling that dissipates overnight. He denies any PND, orthopnea, palpitations, fever, night sweats, easy satiety, new lymphadenopathy, urinary hesitancy, incontinence, diarrhea, constipation, skin rash, headache, or seizures.  MEDICAL HISTORY: Past Medical History  Diagnosis Date  . Coronary atherosclerosis of native coronary artery     Occluded RCA with collaterals, DES to LAD and circumflex 2005  . Essential hypertension, benign   . Non Hodgkin's lymphoma   . Chronic renal insufficiency   . Hyperlipidemia   . Degenerative disc disease, lumbar   . Duodenal ulcer     EGD 6/11 - iron deficiency anemia  . Pericardial effusion     Status post window  . Carotid artery disease     Nonobstructive, less than 50% LICA by angiography 5/12  . Stroke     Occurred 1999, had left atrial appendage thrombus at that time - previously on Coumadin  . Peripheral arterial disease   . Enlarged prostate   . Lymphoma   . Melanoma     INTERIM HISTORY: has Coronary atherosclerosis of native coronary artery; Mixed hyperlipidemia; Essential hypertension, benign; Carotid artery disease; Peripheral arterial disease; Chest pain at rest; Chronic kidney disease, stage III (moderate); Low-Grade, B-Cell, Follicular lymphoma; and  Peripheral vascular disease, unspecified on his problem list.    ALLERGIES:  is allergic to aspirin.  MEDICATIONS: has a current medication list which includes the following prescription(s): alprazolam, clopidogrel, doxazosin, hydralazine, hydrocodone-acetaminophen, lactose free nutrition, lovastatin, metoprolol, nifedipine, nifedipine, pantoprazole, senna-docusate, tamsulosin, and venlafaxine xr.  SURGICAL HISTORY:  Past Surgical History  Procedure Laterality Date  . Cholecystectomy    . Pericardial window    . Aortobifemoral bypass grafting  2005  . Left femoral popliteal bypass grafting  2005  . Coronary angioplasty with stent placement      FAMILY HISTORY: family history includes COPD in his mother; Coronary artery disease in his mother.  SOCIAL HISTORY:  reports that he has been smoking Cigarettes.  He has a 37.5 pack-year smoking history. He has never used smokeless tobacco. He reports that he does not drink alcohol or use illicit drugs.  REVIEW OF SYSTEMS:  Other than that discussed above is noncontributory.  PHYSICAL EXAMINATION: ECOG PERFORMANCE STATUS: 1 - Symptomatic but completely ambulatory  Blood pressure 186/69, pulse 68, temperature 97.5 F (36.4 C), temperature source Oral, resp. rate 20, weight 162 lb 12.8 oz (73.846 kg).  GENERAL:alert, no distress and comfortable SKIN: skin color, texture, turgor are normal, no rashes or significant lesions EYES: PERLA; Conjunctiva are pink and non-injected, sclera clear OROPHARYNX:no exudate, no erythema on lips, buccal mucosa, or tongue. NECK: supple, thyroid normal size, non-tender, without nodularity. No masses CHEST: Normal AP diameter with no breast masses. LYMPH:  no palpable lymphadenopathy in the cervical, axillary or inguinal LUNGS: clear to auscultation and percussion with normal breathing effort HEART: regular rate &  rhythm and no murmurs. ABDOMEN:abdomen soft, non-tender and normal bowel sounds MUSCULOSKELETAL:no  cyanosis of digits and no clubbing. Walks with a cane with hip and knee discomfort.  NEURO: alert & oriented x 3 with fluent speech, no focal motor/sensory deficits   LABORATORY DATA: Infusion on 05/20/2013  Component Date Value Range Status  . Beta-2 Microglobulin 05/20/2013 7.55* 1.01 - 1.73 mg/L Final   Performed at [facility]  . WBC 05/20/2013 7.3  4.0 - 10.5 K/uL Final  . RBC 05/20/2013 4.49  4.22 - 5.81 MIL/uL Final  . Hemoglobin 05/20/2013 13.6  13.0 - 17.0 g/dL Final  . HCT 21/30/8657 41.5  39.0 - 52.0 % Final  . MCV 05/20/2013 92.4  78.0 - 100.0 fL Final  . MCH 05/20/2013 30.3  26.0 - 34.0 pg Final  . MCHC 05/20/2013 32.8  30.0 - 36.0 g/dL Final  . RDW 84/69/6295 13.7  11.5 - 15.5 % Final  . Platelets 05/20/2013 148* 150 - 400 K/uL Final  . Neutrophils Relative % 05/20/2013 67  43 - 77 % Final  . Neutro Abs 05/20/2013 4.9  1.7 - 7.7 K/uL Final  . Lymphocytes Relative 05/20/2013 21  12 - 46 % Final  . Lymphs Abs 05/20/2013 1.5  0.7 - 4.0 K/uL Final  . Monocytes Relative 05/20/2013 8  3 - 12 % Final  . Monocytes Absolute 05/20/2013 0.6  0.1 - 1.0 K/uL Final  . Eosinophils Relative 05/20/2013 3  0 - 5 % Final  . Eosinophils Absolute 05/20/2013 0.3  0.0 - 0.7 K/uL Final  . Basophils Relative 05/20/2013 1  0 - 1 % Final  . Basophils Absolute 05/20/2013 0.1  0.0 - 0.1 K/uL Final  . Sodium 05/20/2013 140  135 - 145 mEq/L Final  . Potassium 05/20/2013 3.7  3.5 - 5.1 mEq/L Final  . Chloride 05/20/2013 103  96 - 112 mEq/L Final  . CO2 05/20/2013 27  19 - 32 mEq/L Final  . Glucose, Bld 05/20/2013 122* 70 - 99 mg/dL Final  . BUN 28/41/3244 34* 6 - 23 mg/dL Final  . Creatinine, Ser 05/20/2013 2.21* 0.50 - 1.35 mg/dL Final  . Calcium 05/25/7251 10.8* 8.4 - 10.5 mg/dL Final  . Total Protein 05/20/2013 6.7  6.0 - 8.3 g/dL Final  . Albumin 66/44/0347 3.5  3.5 - 5.2 g/dL Final  . AST 42/59/5638 22  0 - 37 U/L Final  . ALT 05/20/2013 14  0 - 53 U/L Final  . Alkaline  Phosphatase 05/20/2013 90  39 - 117 U/L Final  . Total Bilirubin 05/20/2013 0.2* 0.3 - 1.2 mg/dL Final  . GFR calc non Af Amer 05/20/2013 26* >90 mL/min Final  . GFR calc Af Amer 05/20/2013 30* >90 mL/min Final   Comment: (NOTE)                          The eGFR has been calculated using the CKD EPI equation.                          This calculation has not been validated in all clinical situations.                          eGFR's persistently <90 mL/min signify possible Chronic Kidney  Disease.  Marland Kitchen LDH 05/20/2013 177  94 - 250 U/L Final  . Retic Ct Pct 05/20/2013 1.2  0.4 - 3.1 % Final  . RBC. 05/20/2013 4.49  4.22 - 5.81 MIL/uL Final  . Retic Count, Manual 05/20/2013 53.9  19.0 - 186.0 K/uL Final    PATHOLOGY: No new pathology.  Urinalysis    Component Value Date/Time   COLORURINE YELLOW 11/21/2011 1519   APPEARANCEUR CLEAR 11/21/2011 1519   LABSPEC 1.010 11/21/2011 1519   PHURINE 6.0 11/21/2011 1519   GLUCOSEU NEGATIVE 11/21/2011 1519   HGBUR NEGATIVE 11/21/2011 1519   BILIRUBINUR NEGATIVE 11/21/2011 1519   KETONESUR NEGATIVE 11/21/2011 1519   PROTEINUR NEGATIVE 11/21/2011 1519   UROBILINOGEN 0.2 11/21/2011 1519   NITRITE NEGATIVE 11/21/2011 1519   LEUKOCYTESUR NEGATIVE 11/21/2011 1519    RADIOGRAPHIC STUDIES: No results found.  ASSESSMENT:  #1. Low-grade B-cell follicular lymphoma, stable, diagnosis May 8 years ago. #2. Symptomatic degenerative joint disease. #3. Peripheral vascular disease, stable. #4. Chronic kidney disease.  PLAN:  #1. Celebrex 100 mg twice a day. Patient had hemorrhagic complications from ibuprofen and Anaprox. #2. Call if develops fever, night sweats, easy satiety, or severe fatigue. #3. Followup in 6 months with lab tests.   All questions were answered. The patient knows to call the clinic with any problems, questions or concerns. We can certainly see the patient much sooner if necessary.   I spent 25 minutes counseling the patient face  to face. The total time spent in the appointment was 30 minutes.    Maurilio Lovely, MD 05/22/2013 11:11 AM

## 2013-05-22 NOTE — Patient Instructions (Signed)
[facility] Discharge Instructions  RECOMMENDATIONS MADE BY THE CONSULTANT AND ANY TEST RESULTS WILL BE SENT TO YOUR REFERRING PHYSICIAN.  A prescription for Celebrex 200 mg two times daily was sent to your pharmacy. Return to clinic in 6 months for lab work prior to MD appointment. Report any issues/concerns to clinic as needed prior to appointment.  Thank you for choosing [facility] to provide your oncology and hematology care.  To afford each patient quality time with our providers, please arrive at least 15 minutes before your scheduled appointment time.  With your help, our goal is to use those 15 minutes to complete the necessary work-up to ensure our physicians have the information they need to help with your evaluation and healthcare recommendations.    Effective January 1st, 2014, we ask that you re-schedule your appointment with our physicians should you arrive 10 or more minutes late for your appointment.  We strive to give you quality time with our providers, and arriving late affects you and other patients whose appointments are after yours.    Again, thank you for choosing [facility].  Our hope is that these requests will decrease the amount of time that you wait before being seen by our physicians.       _____________________________________________________________  Should you have questions after your visit to [facility], please contact our office at [phone] between the hours of 8:30 a.m. and 5:00 p.m.  Voicemails left after 4:30 p.m. will not be returned until the following business day.  For prescription refill requests, have your pharmacy contact our office with your prescription refill request.

## 2013-06-06 ENCOUNTER — Other Ambulatory Visit (HOSPITAL_COMMUNITY): Payer: Self-pay | Admitting: Oncology

## 2013-06-06 ENCOUNTER — Other Ambulatory Visit: Payer: Self-pay | Admitting: Family Medicine

## 2013-06-06 DIAGNOSIS — F410 Panic disorder [episodic paroxysmal anxiety] without agoraphobia: Secondary | ICD-10-CM

## 2013-06-06 MED ORDER — ALPRAZOLAM 0.5 MG PO TABS: ORAL_TABLET | ORAL | Status: DC

## 2013-06-06 NOTE — Telephone Encounter (Signed)
Medication refilled per protocol. 

## 2013-06-13 ENCOUNTER — Other Ambulatory Visit: Payer: Self-pay | Admitting: Family Medicine

## 2013-07-10 ENCOUNTER — Other Ambulatory Visit: Payer: Self-pay | Admitting: Family Medicine

## 2013-08-20 ENCOUNTER — Ambulatory Visit (INDEPENDENT_AMBULATORY_CARE_PROVIDER_SITE_OTHER): Payer: Medicare Other | Admitting: Family Medicine

## 2013-08-20 ENCOUNTER — Encounter: Payer: Self-pay | Admitting: Family Medicine

## 2013-08-20 VITALS — BP 126/50 | HR 78 | Temp 97.0°F | Resp 16 | Ht 69.0 in | Wt 157.0 lb

## 2013-08-20 DIAGNOSIS — I6350 Cerebral infarction due to unspecified occlusion or stenosis of unspecified cerebral artery: Secondary | ICD-10-CM

## 2013-08-20 DIAGNOSIS — I639 Cerebral infarction, unspecified: Secondary | ICD-10-CM

## 2013-08-20 NOTE — Progress Notes (Signed)
Subjective:    Patient ID: Ricardo Schaefer, male    DOB: [birth year], 80 y.o.   MRN: [id number]  HPI  Patient is an 80 year old white male with a significant cardiovascular history including carotid artery disease, coronary artery disease, and peripheral vascular disease who in February developed memory loss, confusion, slurred speech, and weakness. Symptoms gradually resolved over a period of 3 weeks. Today in clinic he is still slurring his speech somewhat. His memory does seem to be affected. He has no unilateral neurologic deficit. There is no paralysis of the face arm or leg. There is no pronator drift on exam. He does have a history of a greater than 70% stenosis of his left internal carotid artery. He is here today for evaluation. He is on Plavix he has a history of allergy to aspirin due to GI bleed. Past Medical History  Diagnosis Date  . Coronary atherosclerosis of native coronary artery     Occluded RCA with collaterals, DES to LAD and circumflex 2005  . Essential hypertension, benign   . Non Hodgkin's lymphoma   . Chronic renal insufficiency   . Hyperlipidemia   . Degenerative disc disease, lumbar   . Duodenal ulcer     EGD 6/11 - iron deficiency anemia  . Pericardial effusion     Status post window  . Carotid artery disease     Nonobstructive, less than 56% LICA by angiography 3/87  . Stroke     Occurred 1999, had left atrial appendage thrombus at that time - previously on Coumadin  . Peripheral arterial disease   . Enlarged prostate   . Lymphoma   . Melanoma    Current Outpatient Prescriptions on File Prior to Visit  Medication Sig Dispense Refill  . ALPRAZolam (XANAX) 0.5 MG tablet Take 1 at 8 am, noon, 4 pm and hs  120 tablet  2  . celecoxib (CELEBREX) 100 MG capsule Take 1 capsule (100 mg total) by mouth 2 (two) times daily.  60 capsule  6  . clopidogrel (PLAVIX) 75 MG tablet TAKE ONE TABLET BY MOUTH EVERY MORNING.  30 tablet  5  . doxazosin (CARDURA) 4 MG tablet TAKE  ONE TABLET BY MOUTH DAILY.  30 tablet  4  . hydrALAZINE (APRESOLINE) 25 MG tablet TAKE ONE TABLET BY MOUTH TWICE DAILY.  60 tablet  5  . HYDROcodone-acetaminophen (NORCO/VICODIN) 5-325 MG per tablet Take 1 tablet by mouth every 6 (six) hours as needed for pain.      Marland Kitchen lovastatin (MEVACOR) 40 MG tablet TAKE ONE TABLET BY MOUTH AT BEDTIME.  30 tablet  5  . metoprolol (LOPRESSOR) 50 MG tablet TAKE ONE TABLET BY MOUTH TWICE DAILY.  60 tablet  4  . NIFEdipine (PROCARDIA XL/ADALAT-CC) 60 MG 24 hr tablet Take 1 tablet (60 mg total) by mouth every morning.  30 tablet  5  . NIFEdipine (PROCARDIA-XL/ADALAT CC) 60 MG 24 hr tablet TAKE ONE TABLET BY MOUTH DAILY IN THE MORNING.  30 tablet  3  . pantoprazole (PROTONIX) 40 MG tablet TAKE ONE TABLET BY MOUTH DAILY.  30 tablet  11  . senna-docusate (SENOKOT-S) 8.6-50 MG per tablet Take 1 tablet by mouth daily.      . tamsulosin (FLOMAX) 0.4 MG CAPS capsule TAKE ONE CAPSULE BY MOUTH DAILY 30 MINUTES AFTER THE SAME MEAL.  30 capsule  5  . venlafaxine XR (EFFEXOR-XR) 150 MG 24 hr capsule TAKE ONE CAPSULE BY MOUTH ONCE EVERY MORNING.  30 capsule  4  No current facility-administered medications on file prior to visit.   Allergies  Allergen Reactions  . Aspirin     Causes stomach bleeds   History   Social History  . Marital Status: Married    Spouse Name: N/A    Number of Children: N/A  . Years of Education: N/A   Occupational History  . Retired    Social History Main Topics  . Smoking status: Current Every Day Smoker -- 0.50 packs/day for 75 years    Types: Cigarettes  . Smokeless tobacco: Never Used  . Alcohol Use: No  . Drug Use: No  . Sexual Activity: Not on file   Other Topics Concern  . Not on file   Social History Narrative  . No narrative on file     Review of Systems  All other systems reviewed and are negative.       Objective:   Physical Exam  Vitals reviewed. Constitutional: He is oriented to person, place, and time. He  appears well-developed and well-nourished. No distress.  Eyes: Conjunctivae are normal.  Neck: Neck supple. No JVD present.  Cardiovascular: Normal rate and regular rhythm.   No murmur heard. Pulses:      Carotid pulses are 1+ on the right side, and 2+ on the left side. Pulmonary/Chest: Effort normal and breath sounds normal. No respiratory distress. He has no wheezes. He has no rales. He exhibits no tenderness.  Abdominal: Soft. Bowel sounds are normal. He exhibits no distension and no mass. There is no tenderness. There is no rebound and no guarding.  Lymphadenopathy:    He has no cervical adenopathy.  Neurological: He is alert and oriented to person, place, and time. He has normal reflexes. He displays normal reflexes. No cranial nerve deficit. He exhibits normal muscle tone. Coordination normal.  Skin: He is not diaphoretic.          Assessment & Plan:  1. CVA (cerebral infarction) I am concerned that the patient suffered a left temporal stroke. I will obtain an MRI of the brain, carotid Dopplers, and echo of heart. The patient has greater than 70% blockage in his left carotid artery and a confirmed stroke he may benefit from CEA. We may also consider reinstituting dual antiplatelet therapy for secondary stroke prevention although this will have to be weighed against the risk of an upper GI bleed. Also rule out embolic sources of a CVA with an echocardiogram of the heart. - MR Brain W Wo Contrast; Future - Carotid duplex; Future - 2D Echocardiogram without contrast; Future

## 2013-08-21 ENCOUNTER — Other Ambulatory Visit: Payer: Self-pay | Admitting: Family Medicine

## 2013-08-26 ENCOUNTER — Other Ambulatory Visit: Payer: Self-pay | Admitting: Family Medicine

## 2013-08-26 ENCOUNTER — Ambulatory Visit (HOSPITAL_COMMUNITY)
Admission: RE | Admit: 2013-08-26 | Discharge: 2013-08-26 | Disposition: A | Discharge status: Home / Self Care | Payer: Medicare Other | Source: Ambulatory Visit | Attending: Family Medicine | Admitting: Family Medicine

## 2013-08-26 DIAGNOSIS — Z8673 Personal history of transient ischemic attack (TIA), and cerebral infarction without residual deficits: Secondary | ICD-10-CM | POA: Insufficient documentation

## 2013-08-26 DIAGNOSIS — I639 Cerebral infarction, unspecified: Secondary | ICD-10-CM

## 2013-08-26 DIAGNOSIS — Z87898 Personal history of other specified conditions: Secondary | ICD-10-CM | POA: Insufficient documentation

## 2013-08-26 DIAGNOSIS — R42 Dizziness and giddiness: Secondary | ICD-10-CM | POA: Insufficient documentation

## 2013-08-26 LAB — POCT I-STAT CREATININE: Creatinine, Ser: 2.2 mg/dL — ABNORMAL HIGH (ref 0.50–1.35)

## 2013-08-29 ENCOUNTER — Other Ambulatory Visit: Payer: Self-pay

## 2013-08-29 ENCOUNTER — Other Ambulatory Visit (INDEPENDENT_AMBULATORY_CARE_PROVIDER_SITE_OTHER): Payer: Medicare Other

## 2013-08-29 ENCOUNTER — Encounter (INDEPENDENT_AMBULATORY_CARE_PROVIDER_SITE_OTHER): Payer: Medicare Other

## 2013-08-29 DIAGNOSIS — I2510 Atherosclerotic heart disease of native coronary artery without angina pectoris: Secondary | ICD-10-CM

## 2013-08-29 DIAGNOSIS — I059 Rheumatic mitral valve disease, unspecified: Secondary | ICD-10-CM

## 2013-08-29 DIAGNOSIS — I359 Nonrheumatic aortic valve disorder, unspecified: Secondary | ICD-10-CM

## 2013-08-29 DIAGNOSIS — I639 Cerebral infarction, unspecified: Secondary | ICD-10-CM

## 2013-08-29 DIAGNOSIS — I6529 Occlusion and stenosis of unspecified carotid artery: Secondary | ICD-10-CM

## 2013-09-02 ENCOUNTER — Other Ambulatory Visit: Payer: Self-pay | Admitting: Family Medicine

## 2013-09-02 DIAGNOSIS — I6529 Occlusion and stenosis of unspecified carotid artery: Secondary | ICD-10-CM

## 2013-09-04 ENCOUNTER — Other Ambulatory Visit: Payer: Self-pay | Admitting: Family Medicine

## 2013-09-04 NOTE — Telephone Encounter (Signed)
Medication refilled per protocol. 

## 2013-09-17 ENCOUNTER — Other Ambulatory Visit (HOSPITAL_COMMUNITY): Payer: Self-pay | Admitting: Oncology

## 2013-09-17 DIAGNOSIS — F410 Panic disorder [episodic paroxysmal anxiety] without agoraphobia: Secondary | ICD-10-CM

## 2013-09-17 MED ORDER — ALPRAZOLAM 0.5 MG PO TABS: ORAL_TABLET | ORAL | Status: DC

## 2013-10-04 ENCOUNTER — Other Ambulatory Visit: Payer: Self-pay | Admitting: Family Medicine

## 2013-10-12 ENCOUNTER — Other Ambulatory Visit: Payer: Self-pay | Admitting: Family Medicine

## 2013-10-22 ENCOUNTER — Ambulatory Visit: Payer: Medicare Other | Admitting: Internal Medicine

## 2013-10-27 ENCOUNTER — Emergency Department (HOSPITAL_COMMUNITY): Payer: Medicare Other

## 2013-10-27 ENCOUNTER — Encounter (HOSPITAL_COMMUNITY): Payer: Self-pay | Admitting: Emergency Medicine

## 2013-10-27 ENCOUNTER — Inpatient Hospital Stay (HOSPITAL_COMMUNITY)
Admission: EM | Admit: 2013-10-27 | Discharge: 2013-11-05 | DRG: 640 | Disposition: A | Discharge status: Skilled Nursing Facility | Payer: Medicare Other | Attending: Internal Medicine | Admitting: Internal Medicine

## 2013-10-27 DIAGNOSIS — N183 Chronic kidney disease, stage 3 unspecified: Secondary | ICD-10-CM

## 2013-10-27 DIAGNOSIS — Z85820 Personal history of malignant melanoma of skin: Secondary | ICD-10-CM

## 2013-10-27 DIAGNOSIS — N184 Chronic kidney disease, stage 4 (severe): Secondary | ICD-10-CM | POA: Diagnosis present

## 2013-10-27 DIAGNOSIS — I739 Peripheral vascular disease, unspecified: Secondary | ICD-10-CM

## 2013-10-27 DIAGNOSIS — Z8673 Personal history of transient ischemic attack (TIA), and cerebral infarction without residual deficits: Secondary | ICD-10-CM

## 2013-10-27 DIAGNOSIS — N179 Acute kidney failure, unspecified: Secondary | ICD-10-CM

## 2013-10-27 DIAGNOSIS — I779 Disorder of arteries and arterioles, unspecified: Secondary | ICD-10-CM

## 2013-10-27 DIAGNOSIS — M5137 Other intervertebral disc degeneration, lumbosacral region: Secondary | ICD-10-CM | POA: Diagnosis present

## 2013-10-27 DIAGNOSIS — Z7902 Long term (current) use of antithrombotics/antiplatelets: Secondary | ICD-10-CM

## 2013-10-27 DIAGNOSIS — R627 Adult failure to thrive: Secondary | ICD-10-CM | POA: Diagnosis present

## 2013-10-27 DIAGNOSIS — I2510 Atherosclerotic heart disease of native coronary artery without angina pectoris: Secondary | ICD-10-CM | POA: Diagnosis present

## 2013-10-27 DIAGNOSIS — I129 Hypertensive chronic kidney disease with stage 1 through stage 4 chronic kidney disease, or unspecified chronic kidney disease: Secondary | ICD-10-CM | POA: Diagnosis present

## 2013-10-27 DIAGNOSIS — D649 Anemia, unspecified: Secondary | ICD-10-CM

## 2013-10-27 DIAGNOSIS — IMO0002 Reserved for concepts with insufficient information to code with codable children: Secondary | ICD-10-CM

## 2013-10-27 DIAGNOSIS — W19XXXA Unspecified fall, initial encounter: Secondary | ICD-10-CM

## 2013-10-27 DIAGNOSIS — E860 Dehydration: Secondary | ICD-10-CM

## 2013-10-27 DIAGNOSIS — E782 Mixed hyperlipidemia: Secondary | ICD-10-CM

## 2013-10-27 DIAGNOSIS — Z8249 Family history of ischemic heart disease and other diseases of the circulatory system: Secondary | ICD-10-CM

## 2013-10-27 DIAGNOSIS — Z66 Do not resuscitate: Secondary | ICD-10-CM | POA: Diagnosis present

## 2013-10-27 DIAGNOSIS — E785 Hyperlipidemia, unspecified: Secondary | ICD-10-CM | POA: Diagnosis present

## 2013-10-27 DIAGNOSIS — C8290 Follicular lymphoma, unspecified, unspecified site: Secondary | ICD-10-CM | POA: Diagnosis present

## 2013-10-27 DIAGNOSIS — F17200 Nicotine dependence, unspecified, uncomplicated: Secondary | ICD-10-CM | POA: Diagnosis present

## 2013-10-27 DIAGNOSIS — Z79899 Other long term (current) drug therapy: Secondary | ICD-10-CM

## 2013-10-27 DIAGNOSIS — Y92009 Unspecified place in unspecified non-institutional (private) residence as the place of occurrence of the external cause: Secondary | ICD-10-CM

## 2013-10-27 DIAGNOSIS — I10 Essential (primary) hypertension: Secondary | ICD-10-CM | POA: Diagnosis present

## 2013-10-27 DIAGNOSIS — N400 Enlarged prostate without lower urinary tract symptoms: Secondary | ICD-10-CM | POA: Diagnosis present

## 2013-10-27 DIAGNOSIS — M51379 Other intervertebral disc degeneration, lumbosacral region without mention of lumbar back pain or lower extremity pain: Secondary | ICD-10-CM | POA: Diagnosis present

## 2013-10-27 DIAGNOSIS — I6529 Occlusion and stenosis of unspecified carotid artery: Secondary | ICD-10-CM | POA: Diagnosis present

## 2013-10-27 DIAGNOSIS — J189 Pneumonia, unspecified organism: Secondary | ICD-10-CM

## 2013-10-27 DIAGNOSIS — Z9181 History of falling: Secondary | ICD-10-CM

## 2013-10-27 DIAGNOSIS — C8299 Follicular lymphoma, unspecified, extranodal and solid organ sites: Secondary | ICD-10-CM | POA: Diagnosis present

## 2013-10-27 DIAGNOSIS — Z9861 Coronary angioplasty status: Secondary | ICD-10-CM

## 2013-10-27 DIAGNOSIS — M79609 Pain in unspecified limb: Secondary | ICD-10-CM | POA: Diagnosis present

## 2013-10-27 DIAGNOSIS — K5900 Constipation, unspecified: Secondary | ICD-10-CM | POA: Diagnosis present

## 2013-10-27 DIAGNOSIS — E440 Moderate protein-calorie malnutrition: Secondary | ICD-10-CM | POA: Diagnosis present

## 2013-10-27 DIAGNOSIS — R509 Fever, unspecified: Secondary | ICD-10-CM | POA: Diagnosis present

## 2013-10-27 DIAGNOSIS — R531 Weakness: Secondary | ICD-10-CM | POA: Diagnosis present

## 2013-10-27 LAB — URINALYSIS, ROUTINE W REFLEX MICROSCOPIC
Bilirubin Urine: NEGATIVE
GLUCOSE, UA: NEGATIVE mg/dL
Ketones, ur: NEGATIVE mg/dL
LEUKOCYTES UA: NEGATIVE
Nitrite: NEGATIVE
PH: 6 (ref 5.0–8.0)
Protein, ur: 100 mg/dL — AB
Urobilinogen, UA: 0.2 mg/dL (ref 0.0–1.0)

## 2013-10-27 LAB — COMPREHENSIVE METABOLIC PANEL
ALK PHOS: 85 U/L (ref 39–117)
ALT: 10 U/L (ref 0–53)
AST: 26 U/L (ref 0–37)
Albumin: 3.1 g/dL — ABNORMAL LOW (ref 3.5–5.2)
BUN: 31 mg/dL — ABNORMAL HIGH (ref 6–23)
CO2: 27 mEq/L (ref 19–32)
Calcium: 11.8 mg/dL — ABNORMAL HIGH (ref 8.4–10.5)
Chloride: 100 mEq/L (ref 96–112)
Creatinine, Ser: 2.05 mg/dL — ABNORMAL HIGH (ref 0.50–1.35)
GFR calc non Af Amer: 28 mL/min — ABNORMAL LOW (ref >90)
GFR, EST AFRICAN AMERICAN: 32 mL/min — AB (ref >90)
Glucose, Bld: 100 mg/dL — ABNORMAL HIGH (ref 70–99)
Potassium: 4.2 mEq/L (ref 3.7–5.3)
Sodium: 141 mEq/L (ref 137–147)
TOTAL PROTEIN: 6.2 g/dL (ref 6.0–8.3)
Total Bilirubin: 0.2 mg/dL — ABNORMAL LOW (ref 0.3–1.2)

## 2013-10-27 LAB — URINE MICROSCOPIC-ADD ON

## 2013-10-27 LAB — CBC WITH DIFFERENTIAL/PLATELET
Basophils Absolute: 0 10*3/uL (ref 0.0–0.1)
Basophils Relative: 1 % (ref 0–1)
EOS ABS: 0.1 10*3/uL (ref 0.0–0.7)
Eosinophils Relative: 2 % (ref 0–5)
HCT: 35.7 % — ABNORMAL LOW (ref 39.0–52.0)
Hemoglobin: 11.5 g/dL — ABNORMAL LOW (ref 13.0–17.0)
LYMPHS PCT: 15 % (ref 12–46)
Lymphs Abs: 0.8 10*3/uL (ref 0.7–4.0)
MCH: 28.1 pg (ref 26.0–34.0)
MCHC: 32.2 g/dL (ref 30.0–36.0)
MCV: 87.3 fL (ref 78.0–100.0)
Monocytes Absolute: 0.8 10*3/uL (ref 0.1–1.0)
Monocytes Relative: 14 % — ABNORMAL HIGH (ref 3–12)
Neutro Abs: 3.9 10*3/uL (ref 1.7–7.7)
Neutrophils Relative %: 68 % (ref 43–77)
PLATELETS: 197 10*3/uL (ref 150–400)
RBC: 4.09 MIL/uL — AB (ref 4.22–5.81)
RDW: 13.8 % (ref 11.5–15.5)
WBC: 5.7 10*3/uL (ref 4.0–10.5)

## 2013-10-27 LAB — TROPONIN I: Troponin I: <0.3 ng/mL (ref <0.30)

## 2013-10-27 MED ORDER — SODIUM CHLORIDE 0.9 % IV BOLUS (SEPSIS)
500.0000 mL | Freq: Once | INTRAVENOUS | Status: AC
  Administered 2013-10-27: 500 mL via INTRAVENOUS

## 2013-10-27 NOTE — ED Notes (Signed)
Pt to department via EMS.  Per report, pt fell about 2 days ago. No complaints following fall.  Reports generalized weakness and fatigue.

## 2013-10-27 NOTE — ED Provider Notes (Addendum)
CSN: [id number]     Arrival date & time 10/27/13  2125 History  This chart was scribed for Ricardo Clonts, MD by Roxan Diesel, ED scribe.  This patient was seen in room [facility]05/[facility]05 and the patient's care was started at 11:23 PM.   Chief Complaint  Patient presents with  . Fatigue  . Fall    The history is provided by the patient. No language interpreter was used.    HPI Comments: Ricardo Schaefer is a 80 y.o. male who presents to the Emergency Department complaining of a fall onset 2 days ago. Pt states that he fell yesterday at 3 AM with associated fatigue. Pt states that he was trying to sit down on a rolling computer chair and fell. Pt states that he hit his left hip and left elbow. He denies head impact. Pt states that he can not "get up and down" since the fall. He states that before the fall he was able to "get up and down" by himself with no problem or aid. He remembers the fall. Pt states that he has a decreased appetite since the fall. Pt states that he had a temperature of 99 earlier today. Pt denies any focal weakness, CP, SOB, abdominal pain, back pain, headache, neck pain, visual changes, eye discharge, vomiting, diarrhea, melena, fevers, chills, frequency, or any other associated symptoms. Pt states that he currently has 2 stents in his heart, a stent in his right leg, HTN, and prior aneurysms. Pt has been having mini-strokes recently. Pt reports that he is not currently on any new medications.    Past Medical History  Diagnosis Date  . Coronary atherosclerosis of native coronary artery     Occluded RCA with collaterals, DES to LAD and circumflex 2005  . Essential hypertension, benign   . Non Hodgkin's lymphoma   . Chronic renal insufficiency   . Hyperlipidemia   . Degenerative disc disease, lumbar   . Duodenal ulcer     EGD 6/11 - iron deficiency anemia  . Pericardial effusion     Status post window  . Carotid artery disease     Nonobstructive, less than 23% LICA by  angiography 5/12  . Stroke     Occurred 1999, had left atrial appendage thrombus at that time - previously on Coumadin  . Peripheral arterial disease   . Enlarged prostate   . Lymphoma   . Melanoma     Past Surgical History  Procedure Laterality Date  . Cholecystectomy    . Pericardial window    . Aortobifemoral bypass grafting  2005  . Left femoral popliteal bypass grafting  2005  . Coronary angioplasty with stent placement      Family History  Problem Relation Age of Onset  . COPD Mother   . Coronary artery disease Mother     History  Substance Use Topics  . Smoking status: Current Every Day Smoker -- 0.50 packs/day for 75 years    Types: Cigarettes  . Smokeless tobacco: Never Used  . Alcohol Use: No     Review of Systems  Constitutional: Positive for appetite change (decreased).  Neurological: Positive for weakness.  All other systems reviewed and are negative.     Allergies  Aspirin  Home Medications   Prior to Admission medications   Medication Sig Start Date End Date Taking? Authorizing Provider  ALPRAZolam Duanne Moron) 0.5 MG tablet Take 0.5 mg by mouth 4 (four) times daily.   Yes Historical Provider, MD  clopidogrel (PLAVIX) 75  MG tablet Take 75 mg by mouth daily with breakfast.   Yes Historical Provider, MD  doxazosin (CARDURA) 4 MG tablet Take 4 mg by mouth daily.   Yes Historical Provider, MD  hydrALAZINE (APRESOLINE) 25 MG tablet Take 50 mg by mouth daily.   Yes Historical Provider, MD  HYDROcodone-acetaminophen (NORCO/VICODIN) 5-325 MG per tablet Take 1 tablet by mouth every 6 (six) hours as needed. pain   Yes Historical Provider, MD  lovastatin (MEVACOR) 40 MG tablet Take 40 mg by mouth at bedtime.   Yes Historical Provider, MD  NIFEdipine (PROCARDIA XL/ADALAT-CC) 60 MG 24 hr tablet Take 60 mg by mouth daily.   Yes Historical Provider, MD  pantoprazole (PROTONIX) 40 MG tablet Take 40 mg by mouth daily.   Yes Historical Provider, MD  tamsulosin (FLOMAX)  0.4 MG CAPS capsule Take 0.4 mg by mouth at bedtime.   Yes Historical Provider, MD  venlafaxine XR (EFFEXOR-XR) 150 MG 24 hr capsule Take 150 mg by mouth daily with breakfast.   Yes Historical Provider, MD   BP 185/62  Pulse 95  Temp(Src) 99.3 F (37.4 C) (Oral)  Resp 20  Ht 5\' 8"  (1.727 m)  Wt 157 lb (71.215 kg)  BMI 23.88 kg/m2  SpO2 94%  Physical Exam  Nursing note and vitals reviewed. Constitutional: He is oriented to person, place, and time. He appears well-developed and well-nourished. No distress.  General fatigued appearance.    HENT:  Head: Normocephalic and atraumatic.  Eyes: Conjunctivae and EOM are normal. Pupils are equal, round, and reactive to light.  Neck: Normal range of motion. Neck supple. No JVD present. No tracheal deviation present.  Cardiovascular: Normal rate.   3+ left sternal border SM Pulses intact upper extremities.   Pulmonary/Chest: Effort normal. No respiratory distress. He has rales (few, sparse).  No retractions.  Abdominal: Soft. There is no tenderness.  Musculoskeletal: Normal range of motion. He exhibits tenderness. He exhibits no edema.  No midline vertebral tenderness. . Superficial abrasion to the left elbow. FROM on the left elbow. No focal elbow bony tenderness. Small ecchymosis on the left lateral.   FROM of left hip without tenderness.  Neurological: He is alert and oriented to person, place, and time.  Mild weakness in both lower extremities, strength 4/5 but equal.  Decreased vision in the right lateral visual field. All other fields intact.  No obvious arm drift.  Finger-to-nose intact bilaterally.  Skin: Skin is warm and dry. No rash noted. There is pallor.  Mild pale skin.    Psychiatric: His behavior is normal.    ED Course  Procedures (including critical care time)  DIAGNOSTIC STUDIES: Oxygen Saturation is 94% on room air, adequate by my interpretation.    COORDINATION OF CARE: 11:34 PM-Discussed treatment plan which  includes CXR, CT scan, and labs with pt at bedside and pt agreed to plan.   Results for orders placed during the hospital encounter of 10/27/13  URINALYSIS, ROUTINE W REFLEX MICROSCOPIC      Result Value Ref Range   Color, Urine YELLOW  YELLOW   APPearance CLEAR  CLEAR   Specific Gravity, Urine >1.030 (*) 1.005 - 1.030   pH 6.0  5.0 - 8.0   Glucose, UA NEGATIVE  NEGATIVE mg/dL   Hgb urine dipstick TRACE (*) NEGATIVE   Bilirubin Urine NEGATIVE  NEGATIVE   Ketones, ur NEGATIVE  NEGATIVE mg/dL   Protein, ur 100 (*) NEGATIVE mg/dL   Urobilinogen, UA 0.2  0.0 - 1.0 mg/dL  Nitrite NEGATIVE  NEGATIVE   Leukocytes, UA NEGATIVE  NEGATIVE  URINE MICROSCOPIC-ADD ON      Result Value Ref Range   RBC / HPF 0-2  <3 RBC/hpf   Bacteria, UA RARE  RARE  CBC WITH DIFFERENTIAL      Result Value Ref Range   WBC 5.7  4.0 - 10.5 K/uL   RBC 4.09 (*) 4.22 - 5.81 MIL/uL   Hemoglobin 11.5 (*) 13.0 - 17.0 g/dL   HCT 35.7 (*) 39.0 - 52.0 %   MCV 87.3  78.0 - 100.0 fL   MCH 28.1  26.0 - 34.0 pg   MCHC 32.2  30.0 - 36.0 g/dL   RDW 13.8  11.5 - 15.5 %   Platelets 197  150 - 400 K/uL   Neutrophils Relative % 68  43 - 77 %   Neutro Abs 3.9  1.7 - 7.7 K/uL   Lymphocytes Relative 15  12 - 46 %   Lymphs Abs 0.8  0.7 - 4.0 K/uL   Monocytes Relative 14 (*) 3 - 12 %   Monocytes Absolute 0.8  0.1 - 1.0 K/uL   Eosinophils Relative 2  0 - 5 %   Eosinophils Absolute 0.1  0.0 - 0.7 K/uL   Basophils Relative 1  0 - 1 %   Basophils Absolute 0.0  0.0 - 0.1 K/uL  COMPREHENSIVE METABOLIC PANEL      Result Value Ref Range   Sodium 141  137 - 147 mEq/L   Potassium 4.2  3.7 - 5.3 mEq/L   Chloride 100  96 - 112 mEq/L   CO2 27  19 - 32 mEq/L   Glucose, Bld 100 (*) 70 - 99 mg/dL   BUN 31 (*) 6 - 23 mg/dL   Creatinine, Ser 2.05 (*) 0.50 - 1.35 mg/dL   Calcium 11.8 (*) 8.4 - 10.5 mg/dL   Total Protein 6.2  6.0 - 8.3 g/dL   Albumin 3.1 (*) 3.5 - 5.2 g/dL   AST 26  0 - 37 U/L   ALT 10  0 - 53 U/L   Alkaline Phosphatase  85  39 - 117 U/L   Total Bilirubin 0.2 (*) 0.3 - 1.2 mg/dL   GFR calc non Af Amer 28 (*) >90 mL/min   GFR calc Af Amer 32 (*) >90 mL/min  TROPONIN I      Result Value Ref Range   Troponin I <0.30  <0.30 ng/mL   Dg Chest 1 View  10/28/2013   CLINICAL DATA:  Left-sided pain after a fall 3 days ago.  EXAM: CHEST - 1 VIEW  COMPARISON:  01/16/2012  FINDINGS: Shallow inspiration. Heart size and pulmonary vascularity are normal. There appear to be calcified pleural plaques over the right hemidiaphragm. No focal airspace disease or consolidation in the lungs. Visualized ribs are nondisplaced. No pneumothorax.  IMPRESSION: No active disease.   Electronically Signed   By: Lucienne Capers M.D.   On: 10/28/2013 01:00   Dg Elbow Complete Left  10/28/2013   CLINICAL DATA:  Left-sided pain after a fall 3 days ago.  EXAM: LEFT ELBOW - COMPLETE 3+ VIEW  COMPARISON:  None.  FINDINGS: Small olecranon spur. There is no evidence of fracture, dislocation, or joint effusion. There is no evidence of arthropathy or other focal bone abnormality. Soft tissues are unremarkable.  IMPRESSION: Negative.   Electronically Signed   By: Lucienne Capers M.D.   On: 10/28/2013 00:58   Dg Hip Complete Left  10/28/2013  CLINICAL DATA:  Left-sided pain after a fall 3 days ago.  EXAM: LEFT HIP - COMPLETE 2+ VIEW  COMPARISON:  None.  FINDINGS: There is no evidence of hip fracture or dislocation. There is no evidence of arthropathy or other focal bone abnormality. Surgical clips over the hips.  IMPRESSION: Negative.   Electronically Signed   By: Lucienne Capers M.D.   On: 10/28/2013 00:57   Ct Head Wo Contrast  10/28/2013   CLINICAL DATA:  Trauma 3 days ago with left arm pain.  EXAM: CT HEAD WITHOUT CONTRAST  TECHNIQUE: Contiguous axial images were obtained from the base of the skull through the vertex without intravenous contrast.  COMPARISON:  MRI of the brain August 26, 2013.  FINDINGS: No intraparenchymal hemorrhage, mass effect or midline  shift. Left medial occipital lobe encephalomalacia, unchanged. Bilateral subcentimeter basal ganglia remote lacunar infarcts. Remote right cerebellar small infarct. The ventricles and sulci are overall normal for patient's age. No acute large vascular territory infarct. Patchy white matter hypodensities unchanged, suggest sequelae of chronic small vessel ischemic disease, within normal range for patient's age.  No abnormal extra-axial fluid collections. Moderate calcific atherosclerosis of the carotid siphons and to lesser extent vertebral arteries. Ocular globes and orbital contents are nonsuspicious though not tailored for evaluation. No skull fracture. Remote left medial orbital blowout fracture. Moderate right temporomandibular osteoarthrosis. Mild paranasal sinus mucosal thickening without air-fluid levels. Mastoid air cells are well aerated.  IMPRESSION: No acute intracranial process.  Stable chronic changes, including remote left posterior cerebral artery territory infarct.   Electronically Signed   By: Elon Alas   On: 10/28/2013 00:55    EKG Interpretation   Date/Time:  Sunday October 27 2013 22:42:25 EDT Ventricular Rate:  88 PR Interval:  155 QRS Duration: 82 QT Interval:  358 QTC Calculation: 433 R Axis:   66 Text Interpretation:  Sinus rhythm Probable anteroseptal infarct, old  similar previous Confirmed by Kamali Sakata  MD, Dionisia Pacholski ([id number]) on 10/28/2013  1:18:53 AM      MDM   Final diagnoses:  Hypercalcemia  General weakness  Dehydration  Fall  Acute renal failure  Anemia   I personally performed the services described in this documentation, which was scribed in my presence. The recorded information has been reviewed and is accurate.  Patient with recent fall and general weakness since then. Neuro exam shows general weakness decreased vision lateral visual field. Blood work shows mild worsening hypercalcemia. Patient clinically dehydrated and small fluid bolus given.  No  improvement on recheck and discuss with triad for admission for further evaluation and possible stroke evaluation.  The patients results and plan were reviewed and discussed.   Any x-rays performed were personally reviewed by myself.   Differential diagnosis were considered with the presenting HPI.  Medications  clopidogrel (PLAVIX) tablet 75 mg (not administered)  doxazosin (CARDURA) tablet 4 mg (not administered)  hydrALAZINE (APRESOLINE) tablet 50 mg (not administered)  HYDROcodone-acetaminophen (NORCO/VICODIN) 5-325 MG per tablet 1 tablet (not administered)  simvastatin (ZOCOR) tablet 20 mg (not administered)  NIFEdipine (PROCARDIA-XL/ADALAT-CC/NIFEDICAL-XL) 24 hr tablet 60 mg (not administered)  pantoprazole (PROTONIX) EC tablet 40 mg (not administered)  tamsulosin (FLOMAX) capsule 0.4 mg (not administered)  venlafaxine XR (EFFEXOR-XR) 24 hr capsule 150 mg (not administered)  enoxaparin (LOVENOX) injection 40 mg (not administered)  ALPRAZolam (XANAX) tablet 0.5 mg (0 mg Oral Duplicate 6/0/10 9323)  sodium chloride 0.9 % bolus 500 mL (0 mLs Intravenous Stopped 10/28/13 0150)  sodium chloride 0.9 % bolus 500  mL (500 mLs Intravenous New Bag/Given 10/28/13 0146)  0.9 %  sodium chloride infusion ( Intravenous New Bag/Given 10/28/13 0337)    Filed Vitals:   10/28/13 0100 10/28/13 0124 10/28/13 0130 10/28/13 0316  BP: 202/60 185/59 189/57 207/61  Pulse: 86 91 88 96  Temp:    98 F (36.7 C)  TempSrc:    Oral  Resp: 29 16 21 20   Height:      Weight:      SpO2: 91% 92% 91% 97%    Admission/ observation were discussed with the admitting physician, patient and/or family and they are comfortable with the plan.     Ricardo Clonts, MD 10/28/13 2130  Ricardo Clonts, MD 10/28/13 [phone]

## 2013-10-27 NOTE — ED Notes (Addendum)
Pt. reports fall 2 days ago. Pt. reports generalized weakness since that time. Pt. caregiver reports pt. unable to walk since fall. Reports pt. Is usually able to walk with use of walker. Pt. Reports constant dizziness and intermittent nausea. Pt. Denies pain. Ace wrap noted to left elbow, pt. Reports he keeps it wrapped for comfort because he often leans on arm. Pt. Alert and oriented.

## 2013-10-28 ENCOUNTER — Encounter (HOSPITAL_COMMUNITY): Payer: Self-pay | Admitting: *Deleted

## 2013-10-28 ENCOUNTER — Inpatient Hospital Stay (HOSPITAL_COMMUNITY): Payer: Medicare Other

## 2013-10-28 DIAGNOSIS — R531 Weakness: Secondary | ICD-10-CM | POA: Diagnosis present

## 2013-10-28 DIAGNOSIS — I10 Essential (primary) hypertension: Secondary | ICD-10-CM

## 2013-10-28 DIAGNOSIS — I739 Peripheral vascular disease, unspecified: Secondary | ICD-10-CM

## 2013-10-28 DIAGNOSIS — C8295 Follicular lymphoma, unspecified, lymph nodes of inguinal region and lower limb: Secondary | ICD-10-CM

## 2013-10-28 DIAGNOSIS — W19XXXA Unspecified fall, initial encounter: Secondary | ICD-10-CM

## 2013-10-28 DIAGNOSIS — N183 Chronic kidney disease, stage 3 unspecified: Secondary | ICD-10-CM

## 2013-10-28 DIAGNOSIS — R5381 Other malaise: Secondary | ICD-10-CM

## 2013-10-28 DIAGNOSIS — R5383 Other fatigue: Secondary | ICD-10-CM

## 2013-10-28 DIAGNOSIS — D630 Anemia in neoplastic disease: Secondary | ICD-10-CM

## 2013-10-28 DIAGNOSIS — E782 Mixed hyperlipidemia: Secondary | ICD-10-CM

## 2013-10-28 LAB — SEDIMENTATION RATE: SED RATE: 16 mm/h (ref 0–16)

## 2013-10-28 LAB — C-REACTIVE PROTEIN: CRP: 8.5 mg/dL — AB (ref <0.60)

## 2013-10-28 LAB — COMPREHENSIVE METABOLIC PANEL
ALT: 8 U/L (ref 0–53)
AST: 26 U/L (ref 0–37)
Albumin: 3 g/dL — ABNORMAL LOW (ref 3.5–5.2)
Alkaline Phosphatase: 83 U/L (ref 39–117)
BUN: 29 mg/dL — AB (ref 6–23)
CALCIUM: 11.5 mg/dL — AB (ref 8.4–10.5)
CO2: 25 meq/L (ref 19–32)
CREATININE: 1.97 mg/dL — AB (ref 0.50–1.35)
Chloride: 104 mEq/L (ref 96–112)
GFR calc Af Amer: 34 mL/min — ABNORMAL LOW (ref >90)
GFR, EST NON AFRICAN AMERICAN: 29 mL/min — AB (ref >90)
GLUCOSE: 86 mg/dL (ref 70–99)
Potassium: 3.9 mEq/L (ref 3.7–5.3)
Sodium: 144 mEq/L (ref 137–147)
Total Bilirubin: 0.2 mg/dL — ABNORMAL LOW (ref 0.3–1.2)
Total Protein: 6.1 g/dL (ref 6.0–8.3)

## 2013-10-28 LAB — CBC
HCT: 35.5 % — ABNORMAL LOW (ref 39.0–52.0)
Hemoglobin: 11.3 g/dL — ABNORMAL LOW (ref 13.0–17.0)
MCH: 28.1 pg (ref 26.0–34.0)
MCHC: 31.8 g/dL (ref 30.0–36.0)
MCV: 88.3 fL (ref 78.0–100.0)
PLATELETS: 209 10*3/uL (ref 150–400)
RBC: 4.02 MIL/uL — AB (ref 4.22–5.81)
RDW: 13.8 % (ref 11.5–15.5)
WBC: 5.6 10*3/uL (ref 4.0–10.5)

## 2013-10-28 LAB — PHOSPHORUS: Phosphorus: 3.3 mg/dL (ref 2.3–4.6)

## 2013-10-28 LAB — LACTATE DEHYDROGENASE: LDH: 352 U/L — ABNORMAL HIGH (ref 94–250)

## 2013-10-28 MED ORDER — HYDRALAZINE HCL 25 MG PO TABS
50.0000 mg | ORAL_TABLET | Freq: Three times a day (TID) | ORAL | Status: DC
  Administered 2013-10-28 – 2013-11-05 (×23): 50 mg via ORAL
  Filled 2013-10-28 (×23): qty 2

## 2013-10-28 MED ORDER — NIFEDIPINE ER OSMOTIC RELEASE 30 MG PO TB24
60.0000 mg | ORAL_TABLET | Freq: Every day | ORAL | Status: DC
  Administered 2013-10-28: 60 mg via ORAL
  Filled 2013-10-28: qty 2

## 2013-10-28 MED ORDER — TAMSULOSIN HCL 0.4 MG PO CAPS
0.4000 mg | ORAL_CAPSULE | Freq: Every day | ORAL | Status: DC
  Administered 2013-10-28 – 2013-11-04 (×8): 0.4 mg via ORAL
  Filled 2013-10-28 (×8): qty 1

## 2013-10-28 MED ORDER — ALPRAZOLAM 0.5 MG PO TABS
0.5000 mg | ORAL_TABLET | Freq: Four times a day (QID) | ORAL | Status: DC
  Administered 2013-10-28 – 2013-11-05 (×31): 0.5 mg via ORAL
  Filled 2013-10-28 (×31): qty 1

## 2013-10-28 MED ORDER — LABETALOL HCL 5 MG/ML IV SOLN
10.0000 mg | INTRAVENOUS | Status: DC | PRN
  Administered 2013-10-28 – 2013-10-31 (×10): 10 mg via INTRAVENOUS
  Filled 2013-10-28 (×10): qty 4

## 2013-10-28 MED ORDER — CLOPIDOGREL BISULFATE 75 MG PO TABS
75.0000 mg | ORAL_TABLET | Freq: Every day | ORAL | Status: DC
  Administered 2013-10-28 – 2013-11-05 (×9): 75 mg via ORAL
  Filled 2013-10-28 (×9): qty 1

## 2013-10-28 MED ORDER — SODIUM CHLORIDE 0.9 % IV SOLN: INTRAVENOUS | Status: DC

## 2013-10-28 MED ORDER — DOXAZOSIN MESYLATE 2 MG PO TABS
4.0000 mg | ORAL_TABLET | Freq: Every day | ORAL | Status: DC
  Administered 2013-10-28 – 2013-11-05 (×9): 4 mg via ORAL
  Filled 2013-10-28 (×9): qty 2

## 2013-10-28 MED ORDER — PANTOPRAZOLE SODIUM 40 MG PO TBEC
40.0000 mg | DELAYED_RELEASE_TABLET | Freq: Every day | ORAL | Status: DC
  Administered 2013-10-28 – 2013-11-05 (×9): 40 mg via ORAL
  Filled 2013-10-28 (×9): qty 1

## 2013-10-28 MED ORDER — DOCUSATE SODIUM 100 MG PO CAPS
100.0000 mg | ORAL_CAPSULE | Freq: Two times a day (BID) | ORAL | Status: DC
  Administered 2013-10-28 – 2013-11-05 (×16): 100 mg via ORAL
  Filled 2013-10-28 (×16): qty 1

## 2013-10-28 MED ORDER — ALPRAZOLAM 0.5 MG PO TABS
0.5000 mg | ORAL_TABLET | Freq: Four times a day (QID) | ORAL | Status: DC
  Administered 2013-10-28: 0.5 mg via ORAL
  Filled 2013-10-28: qty 1

## 2013-10-28 MED ORDER — SODIUM CHLORIDE 0.9 % IV SOLN
INTRAVENOUS | Status: AC
  Administered 2013-10-28: 04:00:00 via INTRAVENOUS

## 2013-10-28 MED ORDER — HYDRALAZINE HCL 25 MG PO TABS
50.0000 mg | ORAL_TABLET | Freq: Every day | ORAL | Status: DC
  Administered 2013-10-28: 50 mg via ORAL
  Filled 2013-10-28: qty 2

## 2013-10-28 MED ORDER — SODIUM CHLORIDE 0.9 % IV SOLN
INTRAVENOUS | Status: DC
  Administered 2013-10-28 – 2013-11-05 (×7): via INTRAVENOUS

## 2013-10-28 MED ORDER — ENOXAPARIN SODIUM 40 MG/0.4ML ~~LOC~~ SOLN
40.0000 mg | SUBCUTANEOUS | Status: DC
Start: 2013-10-28 — End: 2013-10-28
  Administered 2013-10-28: 40 mg via SUBCUTANEOUS
  Filled 2013-10-28: qty 0.4

## 2013-10-28 MED ORDER — ENOXAPARIN SODIUM 30 MG/0.3ML ~~LOC~~ SOLN
30.0000 mg | SUBCUTANEOUS | Status: DC
  Administered 2013-10-29 – 2013-11-05 (×8): 30 mg via SUBCUTANEOUS
  Filled 2013-10-28 (×8): qty 0.3

## 2013-10-28 MED ORDER — VENLAFAXINE HCL ER 75 MG PO CP24
150.0000 mg | ORAL_CAPSULE | Freq: Every day | ORAL | Status: DC
  Administered 2013-10-28 – 2013-11-05 (×9): 150 mg via ORAL
  Filled 2013-10-28 (×9): qty 2

## 2013-10-28 MED ORDER — SODIUM CHLORIDE 0.9 % IV BOLUS (SEPSIS)
500.0000 mL | Freq: Once | INTRAVENOUS | Status: AC
  Administered 2013-10-28: 500 mL via INTRAVENOUS

## 2013-10-28 MED ORDER — ALPRAZOLAM 0.5 MG PO TABS: 0.5000 mg | ORAL_TABLET | Freq: Four times a day (QID) | ORAL | Status: DC

## 2013-10-28 MED ORDER — ENSURE COMPLETE PO LIQD
237.0000 mL | Freq: Two times a day (BID) | ORAL | Status: DC
  Administered 2013-10-28 – 2013-11-05 (×10): 237 mL via ORAL
  Administered 2013-11-05: 13:00:00 via ORAL

## 2013-10-28 MED ORDER — SIMVASTATIN 20 MG PO TABS
20.0000 mg | ORAL_TABLET | Freq: Every day | ORAL | Status: DC
  Administered 2013-10-28 – 2013-11-04 (×8): 20 mg via ORAL
  Filled 2013-10-28 (×9): qty 1

## 2013-10-28 MED ORDER — HYDROCODONE-ACETAMINOPHEN 5-325 MG PO TABS
1.0000 | ORAL_TABLET | Freq: Four times a day (QID) | ORAL | Status: DC | PRN
  Administered 2013-10-28 – 2013-10-31 (×3): 1 via ORAL
  Filled 2013-10-28 (×3): qty 1

## 2013-10-28 NOTE — Progress Notes (Signed)
Patient stated that he had not had a dose of his xanax since 3:00 PM 10/27/13 and asked if he could have a dose now.  MD notified.  Okay to give now dose per MD instruction.  Xanax order modified to include a now dose. Dose given and patient resting comfortably in room.  Will continue to monitor patient.

## 2013-10-28 NOTE — ED Notes (Signed)
Pt. Sat at 88-89% on room air. Pt. Placed on 2L [place], pt. Sat 92%

## 2013-10-28 NOTE — Progress Notes (Addendum)
Notified Dr. Coralyn Pear of the patients bowels hard and abdominal distended.  Also discussed the patients BP still being elevated even with 1 dose of labetalol given.  New order given and followed.

## 2013-10-28 NOTE — ED Notes (Signed)
Hospitalist at bedside 

## 2013-10-28 NOTE — Care Management Utilization Note (Signed)
UR completed 

## 2013-10-28 NOTE — Progress Notes (Signed)
INITIAL NUTRITION ASSESSMENT  DOCUMENTATION CODES Per approved criteria  -Severe malnutrition in the context of chronic illness   INTERVENTION: Ensure Complete po BID, each supplement provides 350 kcal and 13 grams of protein   Recommend liberalize diet to regular   NUTRITION DIAGNOSIS: Inadequate oral intake related to decreased appetite as evidenced by pt diet hx and observed  (0-15%) meals   GOAL: Pt to meet >/= 90% of their estimated nutrition needs    Monitor: Po intake, labs and wt trends   Reason for Assessment: Malnutrition Screen Score =  3  80 y.o. male  Admitting Dx: Weakness  ASSESSMENT: Pt from home. He has experienced increasing weakness and decreased appetite and food intake over past few weeks per Stanton Kidney (his wife). His home diet is regular.He is able to feed himself. He is s/p fall early Saturday morning. He has experienced weight loss trend past year 10#,6% which is not significant. His wife reports pt has has declined recently especially the past month. Hypercalcemia on admission and hx of CKD stage 3, B-cell lymphoma.   His po intake since admission very poor 0-15% meals. He is willing to try Ensure. His intake is inadequate to meet estimated needs.   He meets criteria for severe malnutrition in the context of chronic illness given energy intake </= 75% for >/= 1 month and severe depletion of muscle noted below.   Nutrition Focused Physical Exam:  Subcutaneous Fat:  Orbital Region: mild wasting Upper Arm Region: moderate wasting Thoracic and Lumbar Region: WDL  Muscle:  Temple Region: moderate wasting Clavicle Bone Region: moderate wasting Clavicle and Acromion Bone Region: moderate wasting Scapular Bone Region: mild wasting Dorsal Hand: moderate wasting Patellar Region: severe wasting Anterior Thigh Region: severe wasting Posterior Calf Region: moderate wasting  Edema: none   Height: Ht Readings from Last 1 Encounters:  10/27/13 5\' 8"  (1.727 m)     Weight: Wt Readings from Last 1 Encounters:  10/27/13 157 lb (71.215 kg)    Ideal Body Weight: 154# (70 kg)  % Ideal Body Weight: 102%  Wt Readings from Last 10 Encounters:  10/27/13 157 lb (71.215 kg)  08/20/13 157 lb (71.215 kg)  05/22/13 162 lb 12.8 oz (73.846 kg)  02/25/13 167 lb (75.751 kg)  02/19/13 156 lb 9 oz (71.016 kg)  11/16/12 167 lb 9.6 oz (76.023 kg)  09/18/12 165 lb (74.844 kg)  08/21/12 164 lb (74.39 kg)  08/15/12 167 lb (75.751 kg)  07/31/12 168 lb 12.8 oz (76.567 kg)    Usual Body Weight: 165-167#  % Usual Body Weight: 97%  BMI:  Body mass index is 23.88 kg/(m^2). normal range  Estimated Nutritional Needs: Kcal: 1775-2130 Protein: 85-92 gr Fluid: 1.8-2.1 liters daily  Skin: intact  Diet Order: Cardiac (po's 0-15% meals)  EDUCATION NEEDS: -Education needs addressed   Intake/Output Summary (Last 24 hours) at 10/28/13 1114 Last data filed at 10/28/13 0956  Gross per 24 hour  Intake 501.67 ml  Output    110 ml  Net 391.67 ml    Last BM: 10/27/13  Labs:   Recent Labs Lab 10/27/13 2244 10/28/13 0544  NA 141 144  K 4.2 3.9  CL 100 104  CO2 27 25  BUN 31* 29*  CREATININE 2.05* 1.97*  CALCIUM 11.8* 11.5*  PHOS 3.3  --   GLUCOSE 100* 86    CBG (last 3)  No results found for this basename: GLUCAP,  in the last 72 hours  Scheduled Meds: . ALPRAZolam  0.5  mg Oral QID  . clopidogrel  75 mg Oral Q breakfast  . doxazosin  4 mg Oral Daily  . enoxaparin (LOVENOX) injection  40 mg Subcutaneous Q24H  . hydrALAZINE  50 mg Oral Daily  . NIFEdipine  60 mg Oral Daily  . pantoprazole  40 mg Oral Daily  . simvastatin  20 mg Oral q1800  . tamsulosin  0.4 mg Oral QHS  . venlafaxine XR  150 mg Oral Q breakfast    Continuous Infusions: . sodium chloride      Past Medical History  Diagnosis Date  . Coronary atherosclerosis of native coronary artery     Occluded RCA with collaterals, DES to LAD and circumflex 2005  . Essential  hypertension, benign   . Non Hodgkin's lymphoma   . Chronic renal insufficiency   . Hyperlipidemia   . Degenerative disc disease, lumbar   . Duodenal ulcer     EGD 6/11 - iron deficiency anemia  . Pericardial effusion     Status post window  . Carotid artery disease     Nonobstructive, less than 00% LICA by angiography 7/62  . Stroke     Occurred 1999, had left atrial appendage thrombus at that time - previously on Coumadin  . Peripheral arterial disease   . Enlarged prostate   . Lymphoma   . Melanoma     Past Surgical History  Procedure Laterality Date  . Cholecystectomy    . Pericardial window    . Aortobifemoral bypass grafting  2005  . Left femoral popliteal bypass grafting  2005  . Coronary angioplasty with stent placement      Colman Cater MS,RD,CSG,LDN Office: #[phone] Pager: [phone]

## 2013-10-28 NOTE — Progress Notes (Signed)
Bladder scan done for patient since he had not urinated >4 hours.  He urinated at that time and had only 60cc of residual left in the bladder.

## 2013-10-28 NOTE — Care Management Note (Addendum)
    Page 1 of 1   11/05/2013     1:50:42 PM CARE MANAGEMENT NOTE 11/05/2013  Patient:  Ricardo Schaefer, Ricardo Schaefer   Account Number:  [id number]  Date Initiated:  10/28/2013  Documentation initiated by:  Claretha Cooper  Subjective/Objective Assessment:   Pt lives at home with spouse.No family support. Wife states she can not handle him at home. Would strongly consider SNF     Action/Plan:   Anticipated DC Date:  11/04/2013   Anticipated DC Plan:  SKILLED NURSING FACILITY  In-house referral  Clinical Social Worker      DC Planning Services  CM consult      Choice offered to / List presented to:             Status of service:  Completed, signed off Medicare Important Message given?  YES (If response is "NO", the following Medicare IM given date fields will be blank) Date Medicare IM given:  10/31/2013 Date Additional Medicare IM given:  11/04/2013  Discharge Disposition:  [place]  Per UR Regulation:    If discussed at Long Length of Stay Meetings, dates discussed:   11/05/2013    Comments:  11/04/13 Claretha Cooper RN BSN CM 11:20 CM spoke to pt and wife at bedside. Anticipate DC back to [facility] today or tomorrow  10/30/13 Claretha Cooper RN BSN CM CSW assisting pt with placement at [facility]. Wife states the hospital has the original living will. She needs the original back. CSW notified  10/28/13 Claretha Cooper RN CM

## 2013-10-28 NOTE — Progress Notes (Signed)
Notified Dr. Coralyn Pear of the patients BP this am.  New orders given and followed.

## 2013-10-28 NOTE — H&P (Signed)
PCP:   PICKARD,WARREN TOM, MD   Chief Complaint:  Weakness, fall  HPI:  80 year old male who  has a past medical history of Coronary atherosclerosis of native coronary artery; Essential hypertension, benign; Non Hodgkin's lymphoma; Chronic renal insufficiency; Hyperlipidemia; Degenerative disc disease, lumbar; Duodenal ulcer; Pericardial effusion; Carotid artery disease; Stroke; Peripheral arterial disease; Enlarged prostate; Lymphoma; and Melanoma. Today presents to the ED after patient fell yesterday morning with associated fatigue. As per patient's wife patient got up around 3 AM yesterday morning and was trying to sit on a rolling computer chair when patient fell. Patient hit his left hip and left elbow no head injury,He was unable to get up by himself. As per patient he remembered the fall, there was no loss of consciousness no seizure-like activity noted. Patient has had decreased appetite and poor by mouth intake over the past 2 days. There is no focal weakness, no chest pain or shortness of breath, no slurred speech no dizziness no nausea vomiting or diarrhea. Patient has a history of CAD status post stent, peripheral arterial disease with stent in the right leg. Patient also has a history of follicular lymphoma and has been followed by oncology. No treatment was offered and watchful waiting has been adopted for the lymphoma. In the ED patient found to have hypercalcemia with calcium 11.8, x-ray of the left hip, left elbow were negative for fracture. Patient denies any pain at this time, able to move all extremities. CT head and chest x-ray also done which were negative.  Allergies:   Allergies  Allergen Reactions  . Aspirin     Causes stomach bleeds      Past Medical History  Diagnosis Date  . Coronary atherosclerosis of native coronary artery     Occluded RCA with collaterals, DES to LAD and circumflex 2005  . Essential hypertension, benign   . Non Hodgkin's lymphoma   .  Chronic renal insufficiency   . Hyperlipidemia   . Degenerative disc disease, lumbar   . Duodenal ulcer     EGD 6/11 - iron deficiency anemia  . Pericardial effusion     Status post window  . Carotid artery disease     Nonobstructive, less than 02% LICA by angiography 6/37  . Stroke     Occurred 1999, had left atrial appendage thrombus at that time - previously on Coumadin  . Peripheral arterial disease   . Enlarged prostate   . Lymphoma   . Melanoma     Past Surgical History  Procedure Laterality Date  . Cholecystectomy    . Pericardial window    . Aortobifemoral bypass grafting  2005  . Left femoral popliteal bypass grafting  2005  . Coronary angioplasty with stent placement      Prior to Admission medications   Medication Sig Start Date End Date Taking? Authorizing Provider  ALPRAZolam Duanne Moron) 0.5 MG tablet Take 0.5 mg by mouth 4 (four) times daily.   Yes Historical Provider, MD  clopidogrel (PLAVIX) 75 MG tablet Take 75 mg by mouth daily with breakfast.   Yes Historical Provider, MD  doxazosin (CARDURA) 4 MG tablet Take 4 mg by mouth daily.   Yes Historical Provider, MD  hydrALAZINE (APRESOLINE) 25 MG tablet Take 50 mg by mouth daily.   Yes Historical Provider, MD  HYDROcodone-acetaminophen (NORCO/VICODIN) 5-325 MG per tablet Take 1 tablet by mouth every 6 (six) hours as needed. pain   Yes Historical Provider, MD  lovastatin (MEVACOR) 40 MG tablet Take 40 mg  by mouth at bedtime.   Yes Historical Provider, MD  NIFEdipine (PROCARDIA XL/ADALAT-CC) 60 MG 24 hr tablet Take 60 mg by mouth daily.   Yes Historical Provider, MD  pantoprazole (PROTONIX) 40 MG tablet Take 40 mg by mouth daily.   Yes Historical Provider, MD  tamsulosin (FLOMAX) 0.4 MG CAPS capsule Take 0.4 mg by mouth at bedtime.   Yes Historical Provider, MD  venlafaxine XR (EFFEXOR-XR) 150 MG 24 hr capsule Take 150 mg by mouth daily with breakfast.   Yes Historical Provider, MD    Social History:  reports that he  has been smoking Cigarettes.  He has a 37.5 pack-year smoking history. He has never used smokeless tobacco. He reports that he does not drink alcohol or use illicit drugs.  Family History  Problem Relation Age of Onset  . COPD Mother   . Coronary artery disease Mother      All the positives are listed in BOLD  Review of Systems:  HEENT: Headache, blurred vision, runny nose, sore throat Neck: Hypothyroidism, hyperthyroidism,,lymphadenopathy Chest : Shortness of breath, history of COPD, Asthma Heart : Chest pain, history of coronary arterey disease GI:  Nausea, vomiting, diarrhea, constipation, GERD GU: Dysuria, urgency, frequency of urination, hematuria Neuro: Stroke, seizures, syncope Psych: Depression, anxiety, hallucinations   Physical Exam: Blood pressure 189/57, pulse 88, temperature 99.3 F (37.4 C), temperature source Oral, resp. rate 21, height 5\' 8"  (1.727 m), weight 71.215 kg (157 lb), SpO2 91.00%. Constitutional:   Patient is a malnourished male in no acute distress and cooperative with exam. Head: Normocephalic and atraumatic Mouth: Mucus membranes moist Eyes: PERRL, EOMI, conjunctivae normal Cardiovascular: RRR, S1 normal, S2 normal Pulmonary/Chest: CTAB, no wheezes, rales, or rhonchi Abdominal: Soft. Non-tender, non-distended, bowel sounds are normal, no masses, organomegaly, or guarding present.  Neurological: A&O x3, Strenght is normal and symmetric bilaterally, cranial nerve II-XII are grossly intact, no focal motor deficit, sensory intact to light touch bilaterally.  Extremities : No Cyanosis, Clubbing or Edema  Labs on Admission:  Basic Metabolic Panel:  Recent Labs Lab 10/27/13 2244  NA 141  K 4.2  CL 100  CO2 27  GLUCOSE 100*  BUN 31*  CREATININE 2.05*  CALCIUM 11.8*   Liver Function Tests:  Recent Labs Lab 10/27/13 2244  AST 26  ALT 10  ALKPHOS 85  BILITOT 0.2*  PROT 6.2  ALBUMIN 3.1*   No results found for this basename: LIPASE,  AMYLASE,  in the last 168 hours No results found for this basename: AMMONIA,  in the last 168 hours CBC:  Recent Labs Lab 10/27/13 2244  WBC 5.7  NEUTROABS 3.9  HGB 11.5*  HCT 35.7*  MCV 87.3  PLT 197   Cardiac Enzymes:  Recent Labs Lab 10/27/13 2244  TROPONINI <0.30    BNP (last 3 results) No results found for this basename: PROBNP,  in the last 8760 hours CBG: No results found for this basename: GLUCAP,  in the last 168 hours  Radiological Exams on Admission: Dg Chest 1 View  10/28/2013   CLINICAL DATA:  Left-sided pain after a fall 3 days ago.  EXAM: CHEST - 1 VIEW  COMPARISON:  01/16/2012  FINDINGS: Shallow inspiration. Heart size and pulmonary vascularity are normal. There appear to be calcified pleural plaques over the right hemidiaphragm. No focal airspace disease or consolidation in the lungs. Visualized ribs are nondisplaced. No pneumothorax.  IMPRESSION: No active disease.   Electronically Signed   By: Oren Beckmann.D.  On: 10/28/2013 01:00   Dg Elbow Complete Left  10/28/2013   CLINICAL DATA:  Left-sided pain after a fall 3 days ago.  EXAM: LEFT ELBOW - COMPLETE 3+ VIEW  COMPARISON:  None.  FINDINGS: Small olecranon spur. There is no evidence of fracture, dislocation, or joint effusion. There is no evidence of arthropathy or other focal bone abnormality. Soft tissues are unremarkable.  IMPRESSION: Negative.   Electronically Signed   By: Lucienne Capers M.D.   On: 10/28/2013 00:58   Dg Hip Complete Left  10/28/2013   CLINICAL DATA:  Left-sided pain after a fall 3 days ago.  EXAM: LEFT HIP - COMPLETE 2+ VIEW  COMPARISON:  None.  FINDINGS: There is no evidence of hip fracture or dislocation. There is no evidence of arthropathy or other focal bone abnormality. Surgical clips over the hips.  IMPRESSION: Negative.   Electronically Signed   By: Lucienne Capers M.D.   On: 10/28/2013 00:57   Ct Head Wo Contrast  10/28/2013   CLINICAL DATA:  Trauma 3 days ago with left arm  pain.  EXAM: CT HEAD WITHOUT CONTRAST  TECHNIQUE: Contiguous axial images were obtained from the base of the skull through the vertex without intravenous contrast.  COMPARISON:  MRI of the brain August 26, 2013.  FINDINGS: No intraparenchymal hemorrhage, mass effect or midline shift. Left medial occipital lobe encephalomalacia, unchanged. Bilateral subcentimeter basal ganglia remote lacunar infarcts. Remote right cerebellar small infarct. The ventricles and sulci are overall normal for patient's age. No acute large vascular territory infarct. Patchy white matter hypodensities unchanged, suggest sequelae of chronic small vessel ischemic disease, within normal range for patient's age.  No abnormal extra-axial fluid collections. Moderate calcific atherosclerosis of the carotid siphons and to lesser extent vertebral arteries. Ocular globes and orbital contents are nonsuspicious though not tailored for evaluation. No skull fracture. Remote left medial orbital blowout fracture. Moderate right temporomandibular osteoarthrosis. Mild paranasal sinus mucosal thickening without air-fluid levels. Mastoid air cells are well aerated.  IMPRESSION: No acute intracranial process.  Stable chronic changes, including remote left posterior cerebral artery territory infarct.   Electronically Signed   By: Elon Alas   On: 10/28/2013 00:55    EKG: Independently reviewed. Sinus rhythm   Assessment/Plan Principal Problem:   Weakness Active Problems:   Coronary atherosclerosis of native coronary artery   Essential hypertension, benign   Peripheral arterial disease   Chronic kidney disease, stage III (moderate)   Low-Grade, B-Cell, Follicular lymphoma   Hypercalcemia  Weakness ? Cause, patient has hypercalcemia which could cause weakness. Patient also has a history of stroke in the past. CT head is negative for stroke, does not have any focal deficit. Will obtain MRI brain in the a.m. we'll get PT  consultation.  Hypercalcemia Corrected calcium is 12.52, will start the patient on IV normal saline at 100 mL per hour. Will check PTH, vitamin D level, SPEP and UPEP. Patient may have developed hypercalcemia from the follicular lymphoma.  Low-grade B-cell follicular lymphoma Patient has been followed by oncology as outpatient, no treatment offered watchful waiting. We'll get oncology consultation in a.m. for further recommendations.  Chronic kidney disease stage III Patient's creatinine is almost at baseline. We'll continue to monitor the patient's renal functions in the hospital.  Hypertension Patient's blood pressure is elevated in the ED, he is on multiple antihypertensive medications at home. All the home medications will be started tonight. Who is  DVT prophylaxis Lovenox  Family discussion: Discussed with patient's wife at  bedside   Time Spent on Admission: 65 minutes  [facility] Pager: [phone] 10/28/2013, 2:49 AM  If 7PM-7AM, please contact night-coverage  [email or web address]  Password [email or web address]

## 2013-10-28 NOTE — Progress Notes (Signed)
[facility] PROGRESS NOTE  Ricardo Schaefer QJJ:[id number] DOB: 11/24/1926 DOA: 10/27/2013 PCP: [name], MD  Assessment/Plan: 1. Failure to Thrive/Functional Decline -Initial workup unremarkable so far. He had a negative head CT, CXR did not show infiltrate, u/a was unremarkable, white count 5.6, hemaglobin 11.3, no source of infection, nonfocal exam.  -Had MRI done this morning, results pending. -Pending labs include PTH, SPEP, UPEP, ESR, CRP -Continue IV fluids -PT consultation, may benefit from rehab at SNF  2. Hypercalcemia -Calcium at 11.5, unlikely hypercalcemia of malignancy -Possibly related to CKD and hyperparathyroidism -Pending PTH  3.  Murmur -He appears to have a more pronounced harsh systolic murmur.  -Recent 2D Echo on 08/29/2013 showed EF of 60-65% without significant vavular disease.  -Given change with order echo today  4. HTN -Blood pressures elevated this morning. Will given oral antihypertensive agents this morning.  -Add IV PRN for SBP's greater than 165  5.  Low Grade B-Cell Lymphoma -Medical oncology consulted  6. Stage III CKD -Creatinine stable -Will continue IV fluids  Code Status: DNR Family Communication:  Disposition Plan: PT consult, may benefit from SNF placement    HPI/Subjective: Patient is a 80 y/o with a past medical history of CAD, HTN, Non Hodgkin's Lymphoma, presenting with failure to thrive/functional decline, generalize weakness, having fall at home.    Objective: Filed Vitals:   10/28/13 0905  BP: 195/65  Pulse: 98  Temp:   Resp:     Intake/Output Summary (Last 24 hours) at 10/28/13 0924 Last data filed at 10/28/13 4818  Gross per 24 hour  Intake 261.67 ml  Output    110 ml  Net 151.67 ml   Filed Weights   10/27/13 2130  Weight: 71.215 kg (157 lb)    Exam:   General:  Chronically ill appearing  Cardiovascular: 3/6 harsh systolic murmur, normal H6D1  Respiratory: Clear to auscultation  bilaterally  Abdomen: Soft, nontender, nondistended  Musculoskeletal: Trace edema to lower extremities  Data Reviewed: Basic Metabolic Panel:  Recent Labs Lab 10/27/13 2244 10/28/13 0544  NA 141 144  K 4.2 3.9  CL 100 104  CO2 27 25  GLUCOSE 100* 86  BUN 31* 29*  CREATININE 2.05* 1.97*  CALCIUM 11.8* 11.5*   Liver Function Tests:  Recent Labs Lab 10/27/13 2244 10/28/13 0544  AST 26 26  ALT 10 8  ALKPHOS 85 83  BILITOT 0.2* 0.2*  PROT 6.2 6.1  ALBUMIN 3.1* 3.0*   No results found for this basename: LIPASE, AMYLASE,  in the last 168 hours No results found for this basename: AMMONIA,  in the last 168 hours CBC:  Recent Labs Lab 10/27/13 2244 10/28/13 0711  WBC 5.7 5.6  NEUTROABS 3.9  --   HGB 11.5* 11.3*  HCT 35.7* 35.5*  MCV 87.3 88.3  PLT 197 209   Cardiac Enzymes:  Recent Labs Lab 10/27/13 2244  TROPONINI <0.30   BNP (last 3 results) No results found for this basename: PROBNP,  in the last 8760 hours CBG: No results found for this basename: GLUCAP,  in the last 168 hours  No results found for this or any previous visit (from the past 240 hour(s)).   Studies: Dg Chest 1 View  10/28/2013   CLINICAL DATA:  Left-sided pain after a fall 3 days ago.  EXAM: CHEST - 1 VIEW  COMPARISON:  01/16/2012  FINDINGS: Shallow inspiration. Heart size and pulmonary vascularity are normal. There appear to be calcified pleural plaques over the right hemidiaphragm. No  focal airspace disease or consolidation in the lungs. Visualized ribs are nondisplaced. No pneumothorax.  IMPRESSION: No active disease.   Electronically Signed   By: Burman Nieves M.D.   On: 10/28/2013 01:00   Dg Elbow Complete Left  10/28/2013   CLINICAL DATA:  Left-sided pain after a fall 3 days ago.  EXAM: LEFT ELBOW - COMPLETE 3+ VIEW  COMPARISON:  None.  FINDINGS: Small olecranon spur. There is no evidence of fracture, dislocation, or joint effusion. There is no evidence of arthropathy or other focal  bone abnormality. Soft tissues are unremarkable.  IMPRESSION: Negative.   Electronically Signed   By: Burman Nieves M.D.   On: 10/28/2013 00:58   Dg Hip Complete Left  10/28/2013   CLINICAL DATA:  Left-sided pain after a fall 3 days ago.  EXAM: LEFT HIP - COMPLETE 2+ VIEW  COMPARISON:  None.  FINDINGS: There is no evidence of hip fracture or dislocation. There is no evidence of arthropathy or other focal bone abnormality. Surgical clips over the hips.  IMPRESSION: Negative.   Electronically Signed   By: Burman Nieves M.D.   On: 10/28/2013 00:57   Ct Head Wo Contrast  10/28/2013   CLINICAL DATA:  Trauma 3 days ago with left arm pain.  EXAM: CT HEAD WITHOUT CONTRAST  TECHNIQUE: Contiguous axial images were obtained from the base of the skull through the vertex without intravenous contrast.  COMPARISON:  MRI of the brain August 26, 2013.  FINDINGS: No intraparenchymal hemorrhage, mass effect or midline shift. Left medial occipital lobe encephalomalacia, unchanged. Bilateral subcentimeter basal ganglia remote lacunar infarcts. Remote right cerebellar small infarct. The ventricles and sulci are overall normal for patient's age. No acute large vascular territory infarct. Patchy white matter hypodensities unchanged, suggest sequelae of chronic small vessel ischemic disease, within normal range for patient's age.  No abnormal extra-axial fluid collections. Moderate calcific atherosclerosis of the carotid siphons and to lesser extent vertebral arteries. Ocular globes and orbital contents are nonsuspicious though not tailored for evaluation. No skull fracture. Remote left medial orbital blowout fracture. Moderate right temporomandibular osteoarthrosis. Mild paranasal sinus mucosal thickening without air-fluid levels. Mastoid air cells are well aerated.  IMPRESSION: No acute intracranial process.  Stable chronic changes, including remote left posterior cerebral artery territory infarct.   Electronically Signed   By:  Awilda Metro   On: 10/28/2013 00:55   Mr Brain Wo Contrast  10/28/2013   CLINICAL DATA:  Weakness.  Evaluate for stroke.  EXAM: MRI HEAD WITHOUT CONTRAST  TECHNIQUE: Multiplanar, multiecho pulse sequences of the brain and surrounding structures were obtained without intravenous contrast.  COMPARISON:  Head CT 10/28/2013 and MRI 08/26/2013  FINDINGS: There is no acute infarct. Old left PCA territory infarct is again noted involving the left occipital lobe with evidence of remote hemorrhage. Small, remote infarcts are also again seen in the right cerebellum and bilateral basal ganglia. Patchy T2 hyperintensities in the subcortical and deep cerebral white matter and pons are unchanged and compatible with mild chronic small vessel ischemic disease. Mild to moderate cerebral atrophy is unchanged. There is no evidence of mass, midline shift, or extra-axial fluid collection.  Prior bilateral cataract surgery is noted. Paranasal sinuses and mastoid air cells are clear. Major intracranial vascular flow voids are preserved.  IMPRESSION: 1. No evidence of acute intracranial abnormality. 2. Unchanged, chronic infarcts and small vessel ischemic disease.   Electronically Signed   By: Sebastian Ache   On: 10/28/2013 08:44    Scheduled  Meds: . ALPRAZolam  0.5 mg Oral QID  . clopidogrel  75 mg Oral Q breakfast  . doxazosin  4 mg Oral Daily  . enoxaparin (LOVENOX) injection  40 mg Subcutaneous Q24H  . hydrALAZINE  50 mg Oral Daily  . NIFEdipine  60 mg Oral Daily  . pantoprazole  40 mg Oral Daily  . simvastatin  20 mg Oral q1800  . tamsulosin  0.4 mg Oral QHS  . venlafaxine XR  150 mg Oral Q breakfast   Continuous Infusions: . sodium chloride      Principal Problem:   Weakness Active Problems:   Coronary atherosclerosis of native coronary artery   Essential hypertension, benign   Peripheral arterial disease   Chronic kidney disease, stage III (moderate)   Low-Grade, B-Cell, Follicular lymphoma    Hypercalcemia    Time spent: 35 min   [facility] Pager [phone]. If 7PM-7AM, please contact night-coverage at [email or web address], password [facility] 10/28/2013, 9:24 AM  LOS: 1 day

## 2013-10-28 NOTE — Consult Note (Signed)
[facility] Consultation Oncology  Name: Ricardo Schaefer      MRN: [id number]    Location: H680/S811-03  Date: 10/28/2013 Time:8:54 AM   REFERRING PHYSICIAN:  Oswald Hillock, MD  REASON FOR CONSULT:  Follicular lymphoma and hypercalcemia   DIAGNOSIS:  Low grade, B-cell follicular lymphoma  HISTORY OF PRESENT ILLNESS:   Ricardo Schaefer is an 80 year old Caucasian man who is well-known to the [facility] where he has been followed for a low-grade B-cell follicular lymphoma diagnosed by an inguinal lymph node biopsy 8-10 years ago, not in need of therapy.   The patient reports that he has been feeling fatigued recently and this resulted in a fall.  He denies any LOC and loss of bowel and/or bladder control.   I personally reviewed and went over laboratory results with the patient.  The results are noted within this dictation. He is noted to be hypercalcemic along with poor renal function.  I personally reviewed and went over radiographic studies with the patient.  The results are noted within this dictation.    From a hematology standpoint, he is very stable.  His CBC is not changing.  Other than fatigue, he denies any other B symptom including fevers, chill, night sweats, and unintentional weight loss.    PAST MEDICAL HISTORY:   Past Medical History  Diagnosis Date  . Coronary atherosclerosis of native coronary artery     Occluded RCA with collaterals, DES to LAD and circumflex 2005  . Essential hypertension, benign   . Non Hodgkin's lymphoma   . Chronic renal insufficiency   . Hyperlipidemia   . Degenerative disc disease, lumbar   . Duodenal ulcer     EGD 6/11 - iron deficiency anemia  . Pericardial effusion     Status post window  . Carotid artery disease     Nonobstructive, less than 15% LICA by angiography 9/45  . Stroke     Occurred 1999, had left atrial appendage thrombus at that time - previously on Coumadin  . Peripheral arterial disease   . Enlarged prostate    . Lymphoma   . Melanoma     ALLERGIES: Allergies  Allergen Reactions  . Aspirin     Causes stomach bleeds      MEDICATIONS: I have reviewed the patient's current medications.     PAST SURGICAL HISTORY Past Surgical History  Procedure Laterality Date  . Cholecystectomy    . Pericardial window    . Aortobifemoral bypass grafting  2005  . Left femoral popliteal bypass grafting  2005  . Coronary angioplasty with stent placement      FAMILY HISTORY: Family History  Problem Relation Age of Onset  . COPD Mother   . Coronary artery disease Mother     SOCIAL HISTORY:  reports that he has been smoking Cigarettes.  He has a 37.5 pack-year smoking history. He has never used smokeless tobacco. He reports that he does not drink alcohol or use illicit drugs.  PERFORMANCE STATUS: The patient's performance status is 3 - Symptomatic, >50% confined to bed  PHYSICAL EXAM: Most Recent Vital Signs: Blood pressure 207/61, pulse 96, temperature 98 F (36.7 C), temperature source Oral, resp. rate 20, height 5' 8" (1.727 m), weight 157 lb (71.215 kg), SpO2 97.00%. General appearance: alert, cooperative, appears stated age, no distress and mildly obese Head: Normocephalic, without obvious abnormality, atraumatic Eyes: negative findings: lids and lashes normal, conjunctivae and sclerae normal and corneas clear Neck: supple, symmetrical,  trachea midline Lungs: clear to auscultation bilaterally Heart: regular rate and rhythm and I believe I hear a murmur heard best at 2nd IC LSB as a swooshing sound Abdomen: normal findings: bowel sounds normal and abnormal findings:  obese and I believe I hear a swooshing sound in right renal area, concerning for bruit versus referred sound from cardiac exam Skin: Skin color, texture, turgor normal. No rashes or lesions Lymph nodes: no supraclavicular adenopathy noted, further lymph node exam is hindered by patient positioning Neurologic: Grossly  normal  LABORATORY DATA:  Results for orders placed during the hospital encounter of 10/27/13 (from the past 48 hour(s))  URINALYSIS, ROUTINE W REFLEX MICROSCOPIC     Status: Abnormal   Collection Time    10/27/13 10:10 PM      Result Value Ref Range   Color, Urine YELLOW  YELLOW   APPearance CLEAR  CLEAR   Specific Gravity, Urine >1.030 (*) 1.005 - 1.030   pH 6.0  5.0 - 8.0   Glucose, UA NEGATIVE  NEGATIVE mg/dL   Hgb urine dipstick TRACE (*) NEGATIVE   Bilirubin Urine NEGATIVE  NEGATIVE   Ketones, ur NEGATIVE  NEGATIVE mg/dL   Protein, ur 100 (*) NEGATIVE mg/dL   Urobilinogen, UA 0.2  0.0 - 1.0 mg/dL   Nitrite NEGATIVE  NEGATIVE   Leukocytes, UA NEGATIVE  NEGATIVE  URINE MICROSCOPIC-ADD ON     Status: None   Collection Time    10/27/13 10:10 PM      Result Value Ref Range   RBC / HPF 0-2  <3 RBC/hpf   Bacteria, UA RARE  RARE  CBC WITH DIFFERENTIAL     Status: Abnormal   Collection Time    10/27/13 10:44 PM      Result Value Ref Range   WBC 5.7  4.0 - 10.5 K/uL   RBC 4.09 (*) 4.22 - 5.81 MIL/uL   Hemoglobin 11.5 (*) 13.0 - 17.0 g/dL   HCT 35.7 (*) 39.0 - 52.0 %   MCV 87.3  78.0 - 100.0 fL   MCH 28.1  26.0 - 34.0 pg   MCHC 32.2  30.0 - 36.0 g/dL   RDW 13.8  11.5 - 15.5 %   Platelets 197  150 - 400 K/uL   Neutrophils Relative % 68  43 - 77 %   Neutro Abs 3.9  1.7 - 7.7 K/uL   Lymphocytes Relative 15  12 - 46 %   Lymphs Abs 0.8  0.7 - 4.0 K/uL   Monocytes Relative 14 (*) 3 - 12 %   Monocytes Absolute 0.8  0.1 - 1.0 K/uL   Eosinophils Relative 2  0 - 5 %   Eosinophils Absolute 0.1  0.0 - 0.7 K/uL   Basophils Relative 1  0 - 1 %   Basophils Absolute 0.0  0.0 - 0.1 K/uL  COMPREHENSIVE METABOLIC PANEL     Status: Abnormal   Collection Time    10/27/13 10:44 PM      Result Value Ref Range   Sodium 141  137 - 147 mEq/L   Potassium 4.2  3.7 - 5.3 mEq/L   Chloride 100  96 - 112 mEq/L   CO2 27  19 - 32 mEq/L   Glucose, Bld 100 (*) 70 - 99 mg/dL   BUN 31 (*) 6 - 23 mg/dL    Creatinine, Ser 2.05 (*) 0.50 - 1.35 mg/dL   Calcium 11.8 (*) 8.4 - 10.5 mg/dL   Total Protein 6.2  6.0 -  8.3 g/dL   Albumin 3.1 (*) 3.5 - 5.2 g/dL   AST 26  0 - 37 U/L   ALT 10  0 - 53 U/L   Alkaline Phosphatase 85  39 - 117 U/L   Total Bilirubin 0.2 (*) 0.3 - 1.2 mg/dL   GFR calc non Af Amer 28 (*) >90 mL/Schaefer   GFR calc Af Amer 32 (*) >90 mL/Schaefer   Comment: (NOTE)     The eGFR has been calculated using the CKD EPI equation.     This calculation has not been validated in all clinical situations.     eGFR's persistently <90 mL/Schaefer signify possible Chronic Kidney     Disease.  TROPONIN I     Status: None   Collection Time    10/27/13 10:44 PM      Result Value Ref Range   Troponin I <0.30  <0.30 ng/mL   Comment:            Due to the release kinetics of cTnI,     a negative result within the first hours     of the onset of symptoms does not rule out     myocardial infarction with certainty.     If myocardial infarction is still suspected,     repeat the test at appropriate intervals.  COMPREHENSIVE METABOLIC PANEL     Status: Abnormal   Collection Time    10/28/13  5:44 AM      Result Value Ref Range   Sodium 144  137 - 147 mEq/L   Potassium 3.9  3.7 - 5.3 mEq/L   Chloride 104  96 - 112 mEq/L   CO2 25  19 - 32 mEq/L   Glucose, Bld 86  70 - 99 mg/dL   BUN 29 (*) 6 - 23 mg/dL   Creatinine, Ser 1.97 (*) 0.50 - 1.35 mg/dL   Calcium 11.5 (*) 8.4 - 10.5 mg/dL   Total Protein 6.1  6.0 - 8.3 g/dL   Albumin 3.0 (*) 3.5 - 5.2 g/dL   AST 26  0 - 37 U/L   ALT 8  0 - 53 U/L   Alkaline Phosphatase 83  39 - 117 U/L   Total Bilirubin 0.2 (*) 0.3 - 1.2 mg/dL   GFR calc non Af Amer 29 (*) >90 mL/Schaefer   GFR calc Af Amer 34 (*) >90 mL/Schaefer   Comment: (NOTE)     The eGFR has been calculated using the CKD EPI equation.     This calculation has not been validated in all clinical situations.     eGFR's persistently <90 mL/Schaefer signify possible Chronic Kidney     Disease.  CBC     Status:  Abnormal   Collection Time    10/28/13  7:11 AM      Result Value Ref Range   WBC 5.6  4.0 - 10.5 K/uL   RBC 4.02 (*) 4.22 - 5.81 MIL/uL   Hemoglobin 11.3 (*) 13.0 - 17.0 g/dL   HCT 35.5 (*) 39.0 - 52.0 %   MCV 88.3  78.0 - 100.0 fL   MCH 28.1  26.0 - 34.0 pg   MCHC 31.8  30.0 - 36.0 g/dL   RDW 13.8  11.5 - 15.5 %   Platelets 209  150 - 400 K/uL      RADIOGRAPHY: Dg Chest 1 View  10/28/2013   CLINICAL DATA:  Left-sided pain after a fall 3 days ago.  EXAM: CHEST - 1 VIEW  COMPARISON:  01/16/2012  FINDINGS: Shallow inspiration. Heart size and pulmonary vascularity are normal. There appear to be calcified pleural plaques over the right hemidiaphragm. No focal airspace disease or consolidation in the lungs. Visualized ribs are nondisplaced. No pneumothorax.  IMPRESSION: No active disease.   Electronically Signed   By: Lucienne Capers M.D.   On: 10/28/2013 01:00   Dg Elbow Complete Left  10/28/2013   CLINICAL DATA:  Left-sided pain after a fall 3 days ago.  EXAM: LEFT ELBOW - COMPLETE 3+ VIEW  COMPARISON:  None.  FINDINGS: Small olecranon spur. There is no evidence of fracture, dislocation, or joint effusion. There is no evidence of arthropathy or other focal bone abnormality. Soft tissues are unremarkable.  IMPRESSION: Negative.   Electronically Signed   By: Lucienne Capers M.D.   On: 10/28/2013 00:58   Dg Hip Complete Left  10/28/2013   CLINICAL DATA:  Left-sided pain after a fall 3 days ago.  EXAM: LEFT HIP - COMPLETE 2+ VIEW  COMPARISON:  None.  FINDINGS: There is no evidence of hip fracture or dislocation. There is no evidence of arthropathy or other focal bone abnormality. Surgical clips over the hips.  IMPRESSION: Negative.   Electronically Signed   By: Lucienne Capers M.D.   On: 10/28/2013 00:57   Ct Head Wo Contrast  10/28/2013   CLINICAL DATA:  Trauma 3 days ago with left arm pain.  EXAM: CT HEAD WITHOUT CONTRAST  TECHNIQUE: Contiguous axial images were obtained from the base of the  skull through the vertex without intravenous contrast.  COMPARISON:  MRI of the brain August 26, 2013.  FINDINGS: No intraparenchymal hemorrhage, mass effect or midline shift. Left medial occipital lobe encephalomalacia, unchanged. Bilateral subcentimeter basal ganglia remote lacunar infarcts. Remote right cerebellar small infarct. The ventricles and sulci are overall normal for patient's age. No acute large vascular territory infarct. Patchy white matter hypodensities unchanged, suggest sequelae of chronic small vessel ischemic disease, within normal range for patient's age.  No abnormal extra-axial fluid collections. Moderate calcific atherosclerosis of the carotid siphons and to lesser extent vertebral arteries. Ocular globes and orbital contents are nonsuspicious though not tailored for evaluation. No skull fracture. Remote left medial orbital blowout fracture. Moderate right temporomandibular osteoarthrosis. Mild paranasal sinus mucosal thickening without air-fluid levels. Mastoid air cells are well aerated.  IMPRESSION: No acute intracranial process.  Stable chronic changes, including remote left posterior cerebral artery territory infarct.   Electronically Signed   By: Elon Alas   On: 10/28/2013 00:55   Mr Brain Wo Contrast  10/28/2013   CLINICAL DATA:  Weakness.  Evaluate for stroke.  EXAM: MRI HEAD WITHOUT CONTRAST  TECHNIQUE: Multiplanar, multiecho pulse sequences of the brain and surrounding structures were obtained without intravenous contrast.  COMPARISON:  Head CT 10/28/2013 and MRI 08/26/2013  FINDINGS: There is no acute infarct. Old left PCA territory infarct is again noted involving the left occipital lobe with evidence of remote hemorrhage. Small, remote infarcts are also again seen in the right cerebellum and bilateral basal ganglia. Patchy T2 hyperintensities in the subcortical and deep cerebral white matter and pons are unchanged and compatible with mild chronic small vessel ischemic  disease. Mild to moderate cerebral atrophy is unchanged. There is no evidence of mass, midline shift, or extra-axial fluid collection.  Prior bilateral cataract surgery is noted. Paranasal sinuses and mastoid air cells are clear. Major intracranial vascular flow voids are preserved.  IMPRESSION: 1. No evidence of acute intracranial abnormality. 2. Unchanged,  chronic infarcts and small vessel ischemic disease.   Electronically Signed   By: Logan Bores   On: 10/28/2013 08:44       PATHOLOGY:  None   ASSESSMENT:  1. Hypercalcemia, etiology to be determined.  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 2. Low grade, B-cell follicular lymphoma, not in need of treatment at present time.  3. Normocytic anemia, mild, secondary to #2 4. Abnormal auscultation of cardiac windows with swooshing murmur heard best at 2nd ICS, LSB 5. Abnormal ausculation of abdominal windows with swooshing sound heard at right renal artery window, ?renal bruit versus referred sound from cardiac area? 6. Fatigue, unknown etiology at this time 7. Chronic renal disease, Grade III.     PLAN:  1. I personally reviewed and went over laboratory results with the patient.  The results are noted within this dictation. 2. I personally reviewed and went over radiographic studies with the patient.  The results are noted within this dictation.   3. Labs today: B2M, CRP, ESR, LDH, phosphorus level, SPEP + IFE 4. PTH level pending 5. Please note abnormal ausculation of cardiac and abdomen on exam and assessment 6. Hematology will follow from the periphery. 7. Follow-up at the [facility] as scheduled on 7/1  All questions were answered. The patient knows to call the clinic with any problems, questions or concerns. We can certainly see the patient much sooner if necessary.  Patient and plan discussed with Dr. Farrel Gobble and he is in agreement with the aforementioned.   [facility] 10/28/2013 Pager: [phone]

## 2013-10-29 DIAGNOSIS — R7401 Elevation of levels of liver transaminase levels: Secondary | ICD-10-CM

## 2013-10-29 DIAGNOSIS — I779 Disorder of arteries and arterioles, unspecified: Secondary | ICD-10-CM

## 2013-10-29 DIAGNOSIS — E860 Dehydration: Secondary | ICD-10-CM

## 2013-10-29 DIAGNOSIS — C8299 Follicular lymphoma, unspecified, extranodal and solid organ sites: Secondary | ICD-10-CM

## 2013-10-29 DIAGNOSIS — N179 Acute kidney failure, unspecified: Secondary | ICD-10-CM

## 2013-10-29 DIAGNOSIS — R7402 Elevation of levels of lactic acid dehydrogenase (LDH): Secondary | ICD-10-CM

## 2013-10-29 DIAGNOSIS — R740 Nonspecific elevation of levels of transaminase and lactic acid dehydrogenase [LDH]: Secondary | ICD-10-CM

## 2013-10-29 DIAGNOSIS — I319 Disease of pericardium, unspecified: Secondary | ICD-10-CM

## 2013-10-29 LAB — BASIC METABOLIC PANEL
BUN: 28 mg/dL — ABNORMAL HIGH (ref 6–23)
CALCIUM: 11.6 mg/dL — AB (ref 8.4–10.5)
CHLORIDE: 106 meq/L (ref 96–112)
CO2: 28 meq/L (ref 19–32)
Creatinine, Ser: 1.97 mg/dL — ABNORMAL HIGH (ref 0.50–1.35)
GFR calc Af Amer: 34 mL/min — ABNORMAL LOW (ref >90)
GFR calc non Af Amer: 29 mL/min — ABNORMAL LOW (ref >90)
GLUCOSE: 111 mg/dL — AB (ref 70–99)
Potassium: 4.1 mEq/L (ref 3.7–5.3)
SODIUM: 146 meq/L (ref 137–147)

## 2013-10-29 LAB — CBC
HEMATOCRIT: 36.3 % — AB (ref 39.0–52.0)
HEMOGLOBIN: 11.3 g/dL — AB (ref 13.0–17.0)
MCH: 27.7 pg (ref 26.0–34.0)
MCHC: 31.1 g/dL (ref 30.0–36.0)
MCV: 89 fL (ref 78.0–100.0)
PLATELETS: 220 10*3/uL (ref 150–400)
RBC: 4.08 MIL/uL — AB (ref 4.22–5.81)
RDW: 13.9 % (ref 11.5–15.5)
WBC: 5.9 10*3/uL (ref 4.0–10.5)

## 2013-10-29 LAB — VITAMIN D 25 HYDROXY (VIT D DEFICIENCY, FRACTURES): Vit D, 25-Hydroxy: 31 ng/mL (ref 30–89)

## 2013-10-29 MED ORDER — NIFEDIPINE ER OSMOTIC RELEASE 30 MG PO TB24
90.0000 mg | ORAL_TABLET | Freq: Every day | ORAL | Status: DC
  Administered 2013-10-29 – 2013-11-05 (×8): 90 mg via ORAL
  Filled 2013-10-29 (×8): qty 3

## 2013-10-29 MED ORDER — CLONIDINE HCL 0.1 MG PO TABS: 0.1000 mg | ORAL_TABLET | Freq: Two times a day (BID) | ORAL | Status: DC

## 2013-10-29 NOTE — Clinical Social Work Psychosocial (Signed)
Clinical Social Work Department BRIEF PSYCHOSOCIAL ASSESSMENT 10/29/2013  Patient:  Ricardo Schaefer, Ricardo Schaefer     Account Number:  [id number]     Admit date:  10/27/2013  Clinical Social Worker:  Wyatt Haste  Date/Time:  10/29/2013 03:48 PM  Referred by:  Physician  Date Referred:  10/29/2013 Referred for  SNF Placement   Other Referral:   Interview type:  Family Other interview type:   wife- Ricardo Schaefer    PSYCHOSOCIAL DATA Living Status:  WIFE Admitted from facility:   Level of care:   Primary support name:  Ricardo Schaefer Primary support relationship to patient:  SPOUSE Degree of support available:   supportive    CURRENT CONCERNS Current Concerns  Post-Acute Placement   Other Concerns:    SOCIAL WORK ASSESSMENT / PLAN CSW met with pt's wife at bedside. Pt sleeping during assessment. Pt's wife reports pt collapsed Saturday morning and yelled for her help. She was able to help him up. After his fall, pt stopped eating and drinking. Sunday night, pt's wife said she became very concerned about him and called EMS to bring him to ED. Pt typically does pretty well at home. He requires assist with bathing, but is fairly independent otherwise. Pt ambulates with a cane or a walker. Mrs. Austad has been concerned about taking pt home in current condition. She appeared relieved that PT is recommending SNF. CSW discussed placement process and [facility] Medicare authorization. SNF list provided. Would prefer [facility], but open to [facility] if needed.   Assessment/plan status:  Psychosocial Support/Ongoing Assessment of Needs Other assessment/ plan:   Information/referral to community resources:   SNF list    PATIENT'S/FAMILY'S RESPONSE TO PLAN OF CARE: Pt sleeping during assessment. Pt's wife reports positive feelings regarding [facility] for rehab. CSW will initiate bed search and [facility] Medicare authorization and follow up with bed offers when available.       Ricardo Schaefer, [place]

## 2013-10-29 NOTE — Progress Notes (Addendum)
[facility] PROGRESS NOTE  Race Latour JJH:[id number] DOB: February 13, 1927 DOA: 10/27/2013 PCP: [name], MD  Assessment/Plan: 1. Failure to Thrive/Functional Decline -Initial workup unremarkable so far. He had a negative head CT, CXR did not show infiltrate, u/a was unremarkable, white count 5.6, hemaglobin 11.3, no source of infection, nonfocal exam.  -Had MRI on 10/28/2013 which did not show acute changes.  -Pending labs include PTH, SPEP, UPEP -Continue IV fluids -PT consultation placed, may benefit from rehab at SNF  2. Hypercalcemia -Calcium at 11.6, unlikely hypercalcemia of malignancy -Possibly related to CKD and hyperparathyroidism -Continue IV fluids -Pending PTH  3.  Murmur -He appeared to have a more pronounced harsh systolic murmur on admission.  -Recent 2D Echo on 08/29/2013 showed EF of 60-65% without significant vavular disease,aortic valve sclerosis without stenosis.  -Repeat Echo is pending  4. HTN -Blood pressures remain elevated. I increased his Hydralazine to 50 mg PO TID from q daily dosing yesterday. -Will increase Nifedipine to 90 mg PO q daily -Continue PRN Labetolol   5.  Low Grade B-Cell Lymphoma -Medical oncology consulted, stable   6. Stage III CKD -Creatinine stable -Will continue IV fluids  7. ICA stenosis -Patient having carotid dopplers 10/20/2011 showing right ICA stenosis 50-69%, left ICA stenosis 70% stenosis.  -MRI on this admission did not show acute CVA. Nonfocal exam, appears to have presented with generalized weakness.  -Outpatient vascular surgery follow, has seen Dr Donnetta Hutching in the clinic.   Code Status: DNR Family Communication:  Disposition Plan: PT consult, may benefit from SNF placement    HPI/Subjective: Patient is a 80 y/o with a past medical history of CAD, HTN, Non Hodgkin's Lymphoma, presenting with failure to thrive/functional decline, generalize weakness, having fall at home, admitted to the medicine service on  10/28/2013. During this hospitalization he was seen by medical oncology who felt he was stable in regards to his follicular lymphoma and did not recommend further interventions. He seemed to have a more pronounced murmur, a 2D echo was done on 08/29/2013 showed an EF of 60 to 65%, sclerotic aortic valve without stenosis. Repeat echo is pending.  He was given IV fluids as he was felt to be dehydrated. Patient seemed to improve with this as he was able to get up out of bed and ambulate in his room. His wife also reported that there was an improvement to his appetite. Labs did not show infectious source.   Objective: Filed Vitals:   10/29/13 0547  BP: 191/46  Pulse: 110  Temp:   Resp:     Intake/Output Summary (Last 24 hours) at 10/29/13 0949 Last data filed at 10/28/13 1817  Gross per 24 hour  Intake    480 ml  Output    161 ml  Net    319 ml   Filed Weights   10/27/13 2130  Weight: 71.215 kg (157 lb)    Exam:   General:  Chronically ill appearing  Cardiovascular: 3/6 harsh systolic murmur, normal Y1E5  Respiratory: Clear to auscultation bilaterally  Abdomen: Soft, nontender, nondistended  Musculoskeletal: Trace edema to lower extremities  Data Reviewed: Basic Metabolic Panel:  Recent Labs Lab 10/27/13 2244 10/28/13 0544 10/29/13 0603  NA 141 144 146  K 4.2 3.9 4.1  CL 100 104 106  CO2 27 25 28   GLUCOSE 100* 86 111*  BUN 31* 29* 28*  CREATININE 2.05* 1.97* 1.97*  CALCIUM 11.8* 11.5* 11.6*  PHOS 3.3  --   --    Liver Function  Tests:  Recent Labs Lab 10/27/13 2244 10/28/13 0544  AST 26 26  ALT 10 8  ALKPHOS 85 83  BILITOT 0.2* 0.2*  PROT 6.2 6.1  ALBUMIN 3.1* 3.0*   No results found for this basename: LIPASE, AMYLASE,  in the last 168 hours No results found for this basename: AMMONIA,  in the last 168 hours CBC:  Recent Labs Lab 10/27/13 2244 10/28/13 0711 10/29/13 0603  WBC 5.7 5.6 5.9  NEUTROABS 3.9  --   --   HGB 11.5* 11.3* 11.3*  HCT 35.7*  35.5* 36.3*  MCV 87.3 88.3 89.0  PLT 197 209 220   Cardiac Enzymes:  Recent Labs Lab 10/27/13 2244  TROPONINI <0.30   BNP (last 3 results) No results found for this basename: PROBNP,  in the last 8760 hours CBG: No results found for this basename: GLUCAP,  in the last 168 hours  No results found for this or any previous visit (from the past 240 hour(s)).   Studies: Dg Chest 1 View  10/28/2013   CLINICAL DATA:  Left-sided pain after a fall 3 days ago.  EXAM: CHEST - 1 VIEW  COMPARISON:  01/16/2012  FINDINGS: Shallow inspiration. Heart size and pulmonary vascularity are normal. There appear to be calcified pleural plaques over the right hemidiaphragm. No focal airspace disease or consolidation in the lungs. Visualized ribs are nondisplaced. No pneumothorax.  IMPRESSION: No active disease.   Electronically Signed   By: Lucienne Capers M.D.   On: 10/28/2013 01:00   Dg Elbow Complete Left  10/28/2013   CLINICAL DATA:  Left-sided pain after a fall 3 days ago.  EXAM: LEFT ELBOW - COMPLETE 3+ VIEW  COMPARISON:  None.  FINDINGS: Small olecranon spur. There is no evidence of fracture, dislocation, or joint effusion. There is no evidence of arthropathy or other focal bone abnormality. Soft tissues are unremarkable.  IMPRESSION: Negative.   Electronically Signed   By: Lucienne Capers M.D.   On: 10/28/2013 00:58   Dg Hip Complete Left  10/28/2013   CLINICAL DATA:  Left-sided pain after a fall 3 days ago.  EXAM: LEFT HIP - COMPLETE 2+ VIEW  COMPARISON:  None.  FINDINGS: There is no evidence of hip fracture or dislocation. There is no evidence of arthropathy or other focal bone abnormality. Surgical clips over the hips.  IMPRESSION: Negative.   Electronically Signed   By: Lucienne Capers M.D.   On: 10/28/2013 00:57   Ct Head Wo Contrast  10/28/2013   CLINICAL DATA:  Trauma 3 days ago with left arm pain.  EXAM: CT HEAD WITHOUT CONTRAST  TECHNIQUE: Contiguous axial images were obtained from the base of  the skull through the vertex without intravenous contrast.  COMPARISON:  MRI of the brain August 26, 2013.  FINDINGS: No intraparenchymal hemorrhage, mass effect or midline shift. Left medial occipital lobe encephalomalacia, unchanged. Bilateral subcentimeter basal ganglia remote lacunar infarcts. Remote right cerebellar small infarct. The ventricles and sulci are overall normal for patient's age. No acute large vascular territory infarct. Patchy white matter hypodensities unchanged, suggest sequelae of chronic small vessel ischemic disease, within normal range for patient's age.  No abnormal extra-axial fluid collections. Moderate calcific atherosclerosis of the carotid siphons and to lesser extent vertebral arteries. Ocular globes and orbital contents are nonsuspicious though not tailored for evaluation. No skull fracture. Remote left medial orbital blowout fracture. Moderate right temporomandibular osteoarthrosis. Mild paranasal sinus mucosal thickening without air-fluid levels. Mastoid air cells are well aerated.  IMPRESSION:  No acute intracranial process.  Stable chronic changes, including remote left posterior cerebral artery territory infarct.   Electronically Signed   By: Elon Alas   On: 10/28/2013 00:55   Mr Brain Wo Contrast  10/28/2013   CLINICAL DATA:  Weakness.  Evaluate for stroke.  EXAM: MRI HEAD WITHOUT CONTRAST  TECHNIQUE: Multiplanar, multiecho pulse sequences of the brain and surrounding structures were obtained without intravenous contrast.  COMPARISON:  Head CT 10/28/2013 and MRI 08/26/2013  FINDINGS: There is no acute infarct. Old left PCA territory infarct is again noted involving the left occipital lobe with evidence of remote hemorrhage. Small, remote infarcts are also again seen in the right cerebellum and bilateral basal ganglia. Patchy T2 hyperintensities in the subcortical and deep cerebral white matter and pons are unchanged and compatible with mild chronic small vessel ischemic  disease. Mild to moderate cerebral atrophy is unchanged. There is no evidence of mass, midline shift, or extra-axial fluid collection.  Prior bilateral cataract surgery is noted. Paranasal sinuses and mastoid air cells are clear. Major intracranial vascular flow voids are preserved.  IMPRESSION: 1. No evidence of acute intracranial abnormality. 2. Unchanged, chronic infarcts and small vessel ischemic disease.   Electronically Signed   By: Logan Bores   On: 10/28/2013 08:44    Scheduled Meds: . ALPRAZolam  0.5 mg Oral QID  . clopidogrel  75 mg Oral Q breakfast  . docusate sodium  100 mg Oral BID  . doxazosin  4 mg Oral Daily  . enoxaparin (LOVENOX) injection  30 mg Subcutaneous Q24H  . feeding supplement (ENSURE COMPLETE)  237 mL Oral BID BM  . hydrALAZINE  50 mg Oral 3 times per day  . NIFEdipine  90 mg Oral Daily  . pantoprazole  40 mg Oral Daily  . simvastatin  20 mg Oral q1800  . tamsulosin  0.4 mg Oral QHS  . venlafaxine XR  150 mg Oral Q breakfast   Continuous Infusions: . sodium chloride 75 mL/hr at 10/28/13 2209    Principal Problem:   Weakness Active Problems:   Coronary atherosclerosis of native coronary artery   Essential hypertension, benign   Peripheral arterial disease   Chronic kidney disease, stage III (moderate)   Low-Grade, B-Cell, Follicular lymphoma   Hypercalcemia    Time spent: 35 min   [facility] Pager [phone]. If 7PM-7AM, please contact night-coverage at [email or web address], password [facility] 10/29/2013, 9:49 AM  LOS: 2 days

## 2013-10-29 NOTE — Clinical Social Work Placement (Signed)
Clinical Social Work Department CLINICAL SOCIAL WORK PLACEMENT NOTE 10/29/2013  Patient:  Ricardo Schaefer, Ricardo Schaefer  Account Number:  [id number] Admit date:  10/27/2013  Clinical Social Worker:  Benay Pike, LCSW  Date/time:  10/29/2013 02:18 PM  Clinical Social Work is seeking post-discharge placement for this patient at the following level of care:   [place]   (*CSW will update this form in Epic as items are completed)   10/29/2013  Patient/family provided with [facility] Department of Clinical Social Work's list of facilities offering this level of care within the geographic area requested by the patient (or if unable, by the patient's family).  10/29/2013  Patient/family informed of their freedom to choose among providers that offer the needed level of care, that participate in Medicare, Medicaid or managed care program needed by the patient, have an available bed and are willing to accept the patient.  10/29/2013  Patient/family informed of [facility]' ownership interest in [facility], as well as of the fact that they are under no obligation to receive care at this facility.  PASARR submitted to EDS on 10/29/2013 PASARR number received on 10/29/2013  FL2 transmitted to all facilities in geographic area requested by pt/family on  10/29/2013 FL2 transmitted to all facilities within larger geographic area on   Patient informed that his/her managed care company has contracts with or will negotiate with  certain facilities, including the following:     Patient/family informed of bed offers received:   Patient chooses bed at  Physician recommends and patient chooses bed at    Patient to be transferred to  on   Patient to be transferred to facility by  Patient and family notified of transfer on  Name of family member notified:    The following physician request were entered in Epic:   Additional Comments:  Benay Pike, [place]

## 2013-10-29 NOTE — Evaluation (Signed)
Physical Therapy Evaluation Patient Details Name: Ricardo Schaefer MRN: [id number] DOB: March 01, 1927 Today's Date: 10/29/2013   History of Present Illness  80 year old male who has a past medical history of Coronary atherosclerosis of native coronary artery; Essential hypertension, benign; Non Hodgkin's lymphoma; Chronic renal insufficiency; Hyperlipidemia; Degenerative disc disease, lumbar; Duodenal ulcer; Pericardial effusion; Carotid artery disease; Stroke; Peripheral arterial disease; Enlarged prostate; Lymphoma; and Melanoma.  Clinical Impression  Patient presents to PT after referral from MD to assess mobility skills.  Patient lives in a single story home with his wife with 1 step to get onto the concrete entryway.  Prior to his hospitalization, the patient was mod (I) with bed mobility skills, required some assistance with transfers when fatigued and was able to ambulate in the home without AD and used a cane with ambulation to/from car.  Wife reports the patient fatigues quickly and in the past 5 years has had to help him with transfers in/out of shower onto shower stool secondary to fatigue.  During evaluation, the patient required moderate assistance with bed mobility skills and mod assist, use of RW and bed height raised to transfer sit <-> stand.  Patient required encouragement to participate in functional mobility assessment, as patient reports he is unable to do anything secondary to fatigue.  Patient unable to amb during evaluation secondary to fatigue and inability to advance foot; patient was able to clear feet form floor during weight shifting.  Recommend continued PT while patient is in the hospital to increase strength/activity tolerance and improve functional mobility skills, and SNF placement after discharge to continue PT treatment.  Recommend RW for use with mobility skills (wife does report she thinks the patient has a RW at home, though he doesn't use it).     Follow Up Recommendations  SNF    Equipment Recommendations  Rolling walker with 5" wheels    Recommendations for Other Services       Precautions / Restrictions Precautions Precautions: Fall      Mobility  Bed Mobility Overal bed mobility: Needs Assistance Bed Mobility: Supine to Sit;Sit to Supine     Supine to sit: Min assist;Mod assist (Min/mod assist for trunk) Sit to supine: Min guard      Transfers Overall transfer level: Needs assistance Equipment used: Rolling walker (2 wheeled) Transfers: Sit to/from Stand Sit to Stand: Mod assist         General transfer comment: Patient unable to transfer sit <-> stand with assist x1 with bed lowered, bed raised and assist x1 to transfer  Ambulation/Gait Ambulation/Gait assistance: Total assist Ambulation Distance (Feet): 0 Feet Assistive device: Rolling walker (2 wheeled)       General Gait Details: Patient required encouragment to attempt ambulation.  Patient able to clear Rt foot > Lt foot, however unable to advance feet for ambulation.         Balance Overall balance assessment: Needs assistance Sitting-balance support: Feet supported Sitting balance-Leahy Scale: Fair   Postural control: Posterior lean Standing balance support: Bilateral upper extremity supported;During functional activity (Use of RW in standing. )                                 Pertinent Vitals/Pain No pain reported.     Home Living Family/patient expects to be discharged to:: Skilled nursing facility Living Arrangements: Spouse/significant other               Additional Comments: Patient  lives in a single story home with 1 step entrance.     Prior Function Level of Independence: Needs assistance   Gait / Transfers Assistance Needed: Assistance with transfers when patient felt fatigued for safety and supervision with ascend/descend 1 step entrance.  Patient previously did not use AD for household amb and a standard cane for amb to/from the  car.   ADL's / Homemaking Assistance Needed: Assistance with bathing per wife.               Communication   Communication: No difficulties  Cognition Arousal/Alertness: Awake/alert Behavior During Therapy: WFL for tasks assessed/performed                                 Assessment/Plan    PT Assessment Patient needs continued PT services  PT Diagnosis Difficulty walking;Generalized weakness   PT Problem List Decreased strength;Decreased activity tolerance;Decreased safety awareness;Decreased balance;Decreased mobility  PT Treatment Interventions Balance training;Gait training;Neuromuscular re-education;Functional mobility training;Therapeutic activities;Therapeutic exercise;Stair training   PT Goals (Current goals can be found in the Care Plan section) Acute Rehab PT Goals PT Goal Formulation: With patient/family Time For Goal Achievement: 11/12/13 Potential to Achieve Goals: Good    Frequency Min 3X/week   Barriers to discharge Other (comment) Inability of patient to perform tasks needed to go home.        End of Session Equipment Utilized During Treatment: Gait belt;Oxygen (2L O2) Activity Tolerance: Patient limited by fatigue;Other (comment) (Patient required encouragment to participate in PT eval) Patient left: in bed;with call bell/phone within reach;with nursing/sitter in room           Time: 1057-1119 PT Time Calculation (min): 22 min   Charges:   PT Evaluation $Initial PT Evaluation Tier I: 1 Procedure      Lonna Cobb 10/29/2013, 11:31 AM

## 2013-10-29 NOTE — Progress Notes (Signed)
Subjective: In bed.  Awake.  He denies any complaints this afternoon.  I personally reviewed and went over laboratory results with the patient.  The results are noted within this dictation.   Objective: Vital signs in last 24 hours: Temp:  [98.4 F (36.9 C)-98.6 F (37 C)] 98.4 F (36.9 C) (06/09 0509) Pulse Rate:  [108-113] 110 (06/09 0547) Resp:  [20] 20 (06/09 0509) BP: (121-198)/(46-73) 191/46 mmHg (06/09 0547) SpO2:  [91 %-99 %] 99 % (06/09 0739) FiO2 (%):  [2 %] 2 % (06/09 0739)  Intake/Output from previous day: 06/08 0800 - 06/09 0759 In: 480 [P.O.:480] Out: 161 [Urine:161] Intake/Output this shift:    General appearance: alert, cooperative, appears stated age and no distress  Lab Results:   Recent Labs  10/28/13 0711 10/29/13 0603  WBC 5.6 5.9  HGB 11.3* 11.3*  HCT 35.5* 36.3*  PLT 209 220   BMET  Recent Labs  10/28/13 0544 10/29/13 0603  NA 144 146  K 3.9 4.1  CL 104 106  CO2 25 28  GLUCOSE 86 111*  BUN 29* 28*  CREATININE 1.97* 1.97*  CALCIUM 11.5* 11.6*    Studies/Results: Dg Chest 1 View  10/28/2013   CLINICAL DATA:  Left-sided pain after a fall 3 days ago.  EXAM: CHEST - 1 VIEW  COMPARISON:  01/16/2012  FINDINGS: Shallow inspiration. Heart size and pulmonary vascularity are normal. There appear to be calcified pleural plaques over the right hemidiaphragm. No focal airspace disease or consolidation in the lungs. Visualized ribs are nondisplaced. No pneumothorax.  IMPRESSION: No active disease.   Electronically Signed   By: Lucienne Capers M.D.   On: 10/28/2013 01:00   Dg Elbow Complete Left  10/28/2013   CLINICAL DATA:  Left-sided pain after a fall 3 days ago.  EXAM: LEFT ELBOW - COMPLETE 3+ VIEW  COMPARISON:  None.  FINDINGS: Small olecranon spur. There is no evidence of fracture, dislocation, or joint effusion. There is no evidence of arthropathy or other focal bone abnormality. Soft tissues are unremarkable.  IMPRESSION: Negative.    Electronically Signed   By: Lucienne Capers M.D.   On: 10/28/2013 00:58   Dg Hip Complete Left  10/28/2013   CLINICAL DATA:  Left-sided pain after a fall 3 days ago.  EXAM: LEFT HIP - COMPLETE 2+ VIEW  COMPARISON:  None.  FINDINGS: There is no evidence of hip fracture or dislocation. There is no evidence of arthropathy or other focal bone abnormality. Surgical clips over the hips.  IMPRESSION: Negative.   Electronically Signed   By: Lucienne Capers M.D.   On: 10/28/2013 00:57   Ct Head Wo Contrast  10/28/2013   CLINICAL DATA:  Trauma 3 days ago with left arm pain.  EXAM: CT HEAD WITHOUT CONTRAST  TECHNIQUE: Contiguous axial images were obtained from the base of the skull through the vertex without intravenous contrast.  COMPARISON:  MRI of the brain August 26, 2013.  FINDINGS: No intraparenchymal hemorrhage, mass effect or midline shift. Left medial occipital lobe encephalomalacia, unchanged. Bilateral subcentimeter basal ganglia remote lacunar infarcts. Remote right cerebellar small infarct. The ventricles and sulci are overall normal for patient's age. No acute large vascular territory infarct. Patchy white matter hypodensities unchanged, suggest sequelae of chronic small vessel ischemic disease, within normal range for patient's age.  No abnormal extra-axial fluid collections. Moderate calcific atherosclerosis of the carotid siphons and to lesser extent vertebral arteries. Ocular globes and orbital contents are nonsuspicious though not tailored for evaluation. No  skull fracture. Remote left medial orbital blowout fracture. Moderate right temporomandibular osteoarthrosis. Mild paranasal sinus mucosal thickening without air-fluid levels. Mastoid air cells are well aerated.  IMPRESSION: No acute intracranial process.  Stable chronic changes, including remote left posterior cerebral artery territory infarct.   Electronically Signed   By: Elon Alas   On: 10/28/2013 00:55   Mr Brain Wo  Contrast  10/28/2013   CLINICAL DATA:  Weakness.  Evaluate for stroke.  EXAM: MRI HEAD WITHOUT CONTRAST  TECHNIQUE: Multiplanar, multiecho pulse sequences of the brain and surrounding structures were obtained without intravenous contrast.  COMPARISON:  Head CT 10/28/2013 and MRI 08/26/2013  FINDINGS: There is no acute infarct. Old left PCA territory infarct is again noted involving the left occipital lobe with evidence of remote hemorrhage. Small, remote infarcts are also again seen in the right cerebellum and bilateral basal ganglia. Patchy T2 hyperintensities in the subcortical and deep cerebral white matter and pons are unchanged and compatible with mild chronic small vessel ischemic disease. Mild to moderate cerebral atrophy is unchanged. There is no evidence of mass, midline shift, or extra-axial fluid collection.  Prior bilateral cataract surgery is noted. Paranasal sinuses and mastoid air cells are clear. Major intracranial vascular flow voids are preserved.  IMPRESSION: 1. No evidence of acute intracranial abnormality. 2. Unchanged, chronic infarcts and small vessel ischemic disease.   Electronically Signed   By: Logan Bores   On: 10/28/2013 08:44    Medications: I have reviewed the patient's current medications.  Assessment/Plan: 1. Hypercalcemia, etiology not fully elucidated.  Lymphoma-induced hypercalcemia usually is associated with an elevated Vitamin D level which is not the case in the patient's situation.  PTH testing is pending. Would not recommend Pamidronate to treat hypercalcemia until PTH testing is resulted. ? Secondary to renal disease? Nephrology consulted 2. Low grade, B-cell follicular lymphoma, not in need of treatment at present time and has not received treatment since diagnosis.  He is a poor candidate for chemotherapeutic intervention. 3. Normocytic anemia, mild, stable. 4. Fatigue, multifactorial 5. Chronic renal disease, Grade III.  Nephrology consulted 6. LDH elevated,  etiology not clear at this time. Nonspecific. ?Lymphoma progression. 7. [facility] at [facility] as scheduled on 11/20/2013. 8. Hematology will follow from the periphery.  Please call the clinic if re-evaluation is desired: [phone]  Patient and plan discussed with Dr. Farrel Gobble and he is in agreement with the aforementioned.     LOS: 2 days    [facility] 10/29/2013 Pager: [phone]

## 2013-10-29 NOTE — Clinical Documentation Improvement (Signed)
Possible Clinical Conditions? Severe Malnutrition   Protein Calorie Malnutrition Severe Protein Calorie Malnutrition Other Condition Cannot clinically determine  Supporting Information: NUTRITIONAL ASSESSMENT by Frederik Schmidt, RD at 10/28/2013 11:14 AM : Diagnostics:DOCUMENTATION CODES  Per approved criteria  -Severe malnutrition in the context of chronic illness   INTERVENTION: Ensure Complete po BID, each supplement provides 350 kcal and 13 grams of protein  Recommend liberalize diet to regular   Thank You, Alessandra Grout, RN, BSN, CCDS, Clinical Documentation Specialist:  [phone]   [phone]=[facility]

## 2013-10-29 NOTE — Progress Notes (Signed)
  Echocardiogram 2D Echocardiogram has been performed.  Ricardo Schaefer 10/29/2013, 10:54 AM

## 2013-10-30 DIAGNOSIS — D649 Anemia, unspecified: Secondary | ICD-10-CM

## 2013-10-30 LAB — IMMUNOFIXATION ELECTROPHORESIS
IgA: 97 mg/dL (ref 68–379)
IgG (Immunoglobin G), Serum: 541 mg/dL — ABNORMAL LOW (ref 650–1600)
IgM, Serum: 57 mg/dL — ABNORMAL LOW (ref 41–251)
Total Protein ELP: 5.7 g/dL — ABNORMAL LOW (ref 6.0–8.3)

## 2013-10-30 LAB — BASIC METABOLIC PANEL WITH GFR
BUN: 25 mg/dL — ABNORMAL HIGH (ref 6–23)
CO2: 26 meq/L (ref 19–32)
Calcium: 11.5 mg/dL — ABNORMAL HIGH (ref 8.4–10.5)
Chloride: 105 meq/L (ref 96–112)
Creatinine, Ser: 1.88 mg/dL — ABNORMAL HIGH (ref 0.50–1.35)
GFR calc Af Amer: 36 mL/min — ABNORMAL LOW
GFR calc non Af Amer: 31 mL/min — ABNORMAL LOW
Glucose, Bld: 109 mg/dL — ABNORMAL HIGH (ref 70–99)
Potassium: 4 meq/L (ref 3.7–5.3)
Sodium: 144 meq/L (ref 137–147)

## 2013-10-30 LAB — UIFE/LIGHT CHAINS/TP QN, 24-HR UR
ALPHA 1 UR: DETECTED — AB
ALPHA 2 UR: DETECTED — AB
Albumin, U: DETECTED
Beta, Urine: DETECTED — AB
Free Kappa Lt Chains,Ur: 71 mg/dL — ABNORMAL HIGH (ref 0.14–2.42)
Free Kappa/Lambda Ratio: 18.73 ratio — ABNORMAL HIGH (ref 2.04–10.37)
Free Lambda Lt Chains,Ur: 3.79 mg/dL — ABNORMAL HIGH (ref 0.02–0.67)
Gamma Globulin, Urine: DETECTED — AB
Total Protein, Urine: 181.4 mg/dL

## 2013-10-30 LAB — PROTEIN ELECTROPHORESIS, SERUM
Albumin ELP: 53.9 % — ABNORMAL LOW (ref 55.8–66.1)
Alpha-1-Globulin: 7.2 % — ABNORMAL HIGH (ref 2.9–4.9)
Alpha-2-Globulin: 17.9 % — ABNORMAL HIGH (ref 7.1–11.8)
Beta 2: 5.4 % (ref 3.2–6.5)
Beta Globulin: 5.9 % (ref 4.7–7.2)
Gamma Globulin: 9.7 % — ABNORMAL LOW (ref 11.1–18.8)
M-Spike, %: NOT DETECTED g/dL
Total Protein ELP: 5.8 g/dL — ABNORMAL LOW (ref 6.0–8.3)

## 2013-10-30 LAB — CBC
HCT: 34.5 % — ABNORMAL LOW (ref 39.0–52.0)
HEMOGLOBIN: 10.9 g/dL — AB (ref 13.0–17.0)
MCH: 28.1 pg (ref 26.0–34.0)
MCHC: 31.6 g/dL (ref 30.0–36.0)
MCV: 88.9 fL (ref 78.0–100.0)
Platelets: 206 10*3/uL (ref 150–400)
RBC: 3.88 MIL/uL — ABNORMAL LOW (ref 4.22–5.81)
RDW: 14.1 % (ref 11.5–15.5)
WBC: 7.2 10*3/uL (ref 4.0–10.5)

## 2013-10-30 LAB — BETA 2 MICROGLOBULIN, SERUM: BETA 2 MICROGLOBULIN: 17 mg/L — AB (ref <=2.51)

## 2013-10-30 MED ORDER — FUROSEMIDE 10 MG/ML IJ SOLN
40.0000 mg | Freq: Every day | INTRAMUSCULAR | Status: DC
  Administered 2013-10-30: 40 mg via INTRAVENOUS
  Filled 2013-10-30: qty 4

## 2013-10-30 NOTE — Clinical Social Work Note (Signed)
CSW presented bed offer at [facility] which was preference and pt and wife accept. Facility notified. CSW left voicemail for [facility] to call authorization which was approved yesterday. Awaiting stability for d/c.   Benay Pike, [place]

## 2013-10-30 NOTE — Progress Notes (Signed)
[facility] PROGRESS NOTE  Ricardo Schaefer PYK:[id number] DOB: March 04, 1927 DOA: 10/27/2013 PCP: [name], MD  Assessment/Plan: 1. Failure to Thrive/Functional Decline -Initial workup unremarkable so far. He had a negative head CT, CXR did not show infiltrate, u/a was unremarkable, white count 5.6, hemaglobin 11.3, no source of infection, nonfocal exam.  -Had MRI on 10/28/2013 which did not show acute changes.  -Pending labs include PTH, SPEP, UPEP results are back.  -Continue IV fluids -PT consultation placed, may benefit from rehab at SNF  2. Hypercalcemia -Calcium at 11.5, unlikely hypercalcemia of malignancy -Possibly related to CKD and hyperparathyroidism -Continue IV fluids -Pending PTH  3.  Murmur -He appeared to have a more pronounced harsh systolic murmur on admission.  -Recent 2D Echo on 08/29/2013 showed EF of 60-65% without significant vavular disease,aortic valve sclerosis without stenosis.  -Repeat Echo show EF of 75%. With grade 1 diastolic dysfunction. Mild LVH.   4. HTN -Blood pressures remain elevated. I increased his Hydralazine to 50 mg PO TID from q daily dosing yesterday. -Will increase Nifedipine to 90 mg PO q daily -Continue PRN Labetolol   5.  Low Grade B-Cell Lymphoma -Medical oncology consulted, stable   6. Stage III CKD -Creatinine stable AND BETTER THAN baseline.  -Will continue IV fluids  7. ICA stenosis -Patient having carotid dopplers 10/20/2011 showing right ICA stenosis 50-69%, left ICA stenosis 70% stenosis.  -MRI on this admission did not show acute CVA. Nonfocal exam, appears to have presented with generalized weakness.  -Outpatient vascular surgery follow, has seen Dr Donnetta Hutching in the clinic.   Code Status: DNR Family Communication:  Disposition Plan: PT consult, may benefit from SNF placement    HPI/Subjective: Patient is a 80 y/o with a past medical history of CAD, HTN, Non Hodgkin's Lymphoma, presenting with failure to  thrive/functional decline, generalize weakness, having fall at home, admitted to the medicine service on 10/28/2013. During this hospitalization he was seen by medical oncology who felt he was stable in regards to his follicular lymphoma and did not recommend further interventions. He seemed to have a more pronounced murmur, a 2D echo was done on 08/29/2013 showed an EF of 60 to 65%, sclerotic aortic valve without stenosis. Repeat echo is pending.  He was given IV fluids as he was felt to be dehydrated. Patient seemed to improve with this as he was able to get up out of bed and ambulate in his room. His wife also reported that there was an improvement to his appetite. Labs did not show infectious source.   Objective: Filed Vitals:   10/30/13 1437  BP: 192/43  Pulse: 107  Temp: 98.3 F (36.8 C)  Resp: 18    Intake/Output Summary (Last 24 hours) at 10/30/13 1536 Last data filed at 10/29/13 2012  Gross per 24 hour  Intake    240 ml  Output    100 ml  Net    140 ml   Filed Weights   10/27/13 2130  Weight: 71.215 kg (157 lb)    Exam:   General:  Chronically ill appearing  Cardiovascular: 3/6 harsh systolic murmur, normal N3Z7  Respiratory: Clear to auscultation bilaterally  Abdomen: Soft, nontender, nondistended  Musculoskeletal: Trace edema to lower extremities  Data Reviewed: Basic Metabolic Panel:  Recent Labs Lab 10/27/13 2244 10/28/13 0544 10/29/13 0603 10/30/13 0546  NA 141 144 146 144  K 4.2 3.9 4.1 4.0  CL 100 104 106 105  CO2 27 25 28 26   GLUCOSE 100* 86 111*  109*  BUN 31* 29* 28* 25*  CREATININE 2.05* 1.97* 1.97* 1.88*  CALCIUM 11.8* 11.5* 11.6* 11.5*  PHOS 3.3  --   --   --    Liver Function Tests:  Recent Labs Lab 10/27/13 2244 10/28/13 0544  AST 26 26  ALT 10 8  ALKPHOS 85 83  BILITOT 0.2* 0.2*  PROT 6.2 6.1  ALBUMIN 3.1* 3.0*   No results found for this basename: LIPASE, AMYLASE,  in the last 168 hours No results found for this basename:  AMMONIA,  in the last 168 hours CBC:  Recent Labs Lab 10/27/13 2244 10/28/13 0711 10/29/13 0603 10/30/13 0546  WBC 5.7 5.6 5.9 7.2  NEUTROABS 3.9  --   --   --   HGB 11.5* 11.3* 11.3* 10.9*  HCT 35.7* 35.5* 36.3* 34.5*  MCV 87.3 88.3 89.0 88.9  PLT 197 209 220 206   Cardiac Enzymes:  Recent Labs Lab 10/27/13 2244  TROPONINI <0.30   BNP (last 3 results) No results found for this basename: PROBNP,  in the last 8760 hours CBG: No results found for this basename: GLUCAP,  in the last 168 hours  No results found for this or any previous visit (from the past 240 hour(s)).   Studies: No results found.  Scheduled Meds: . ALPRAZolam  0.5 mg Oral QID  . clopidogrel  75 mg Oral Q breakfast  . docusate sodium  100 mg Oral BID  . doxazosin  4 mg Oral Daily  . enoxaparin (LOVENOX) injection  30 mg Subcutaneous Q24H  . feeding supplement (ENSURE COMPLETE)  237 mL Oral BID BM  . hydrALAZINE  50 mg Oral 3 times per day  . NIFEdipine  90 mg Oral Daily  . pantoprazole  40 mg Oral Daily  . simvastatin  20 mg Oral q1800  . tamsulosin  0.4 mg Oral QHS  . venlafaxine XR  150 mg Oral Q breakfast   Continuous Infusions: . sodium chloride 75 mL/hr at 10/30/13 0705    Principal Problem:   Weakness Active Problems:   Coronary atherosclerosis of native coronary artery   Essential hypertension, benign   Peripheral arterial disease   Chronic kidney disease, stage III (moderate)   Low-Grade, B-Cell, Follicular lymphoma   Hypercalcemia    Time spent: 35 min   [place]  [facility] Pager [phone]. If 7PM-7AM, please contact night-coverage at [email or web address], password [facility] 10/30/2013, 3:36 PM  LOS: 3 days

## 2013-10-30 NOTE — Progress Notes (Signed)
Physical Therapy Treatment Patient Details Name: Ricardo Schaefer MRN: [id number] DOB: 29-Aug-1926 Today's Date: 10/30/2013    History of Present Illness 80 year old male who has a past medical history of Coronary atherosclerosis of native coronary artery; Essential hypertension, benign; Non Hodgkin's lymphoma; Chronic renal insufficiency; Hyperlipidemia; Degenerative disc disease, lumbar; Duodenal ulcer; Pericardial effusion; Carotid artery disease; Stroke; Peripheral arterial disease; Enlarged prostate; Lymphoma; and Melanoma.    PT Comments    Patient agreeable to supine exercises today, though required AAROM/AROM to complete exercises secondary to lethargy.   Attempted exercises at EOB, however patient reports increased complaints of LBP during transfer supine -> sit, and was unable to maintain balance at EOB without min/mod assist during exercises.  Patient refused attempting to stand today, despite encouragement.    Follow Up Recommendations  SNF     Equipment Recommendations  Rolling walker with 5" wheels           Mobility  Bed Mobility Overal bed mobility: Needs Assistance Bed Mobility: Supine to Sit;Sit to Supine     Supine to sit: Mod assist;HOB elevated (Patient very lethargic, complaints of LBP during transfers) Sit to supine: Min guard   General bed mobility comments: Increased assistance with supine -> sit today secondary to lethergy and complaints of pain in low back limiting patient ability to sit upright  Transfers                 General transfer comment: Patient refused attempting to stand today, despite encouragement        Balance Overall balance assessment: Needs assistance Sitting-balance support: Bilateral upper extremity supported;Feet supported Sitting balance-Leahy Scale: Poor Sitting balance - Comments: Patient demonstrated decreased sitting balance today, secondary to complaints of LBP.  Patient able to maintain balance ~10 seconds prior to  LOB requiring min guard/min assist to correct.  Mod assist required to maintain upright position during tx.                             Cognition Arousal/Alertness: Therapist, sports Exercises - Lower Extremity Ankle Circles/Pumps: AROM;AAROM;Both;10 reps Heel Slides: AROM;Right;Left;10 reps Hip Flexion/Marching: Strengthening;Both;10 reps        Pertinent Vitals/Pain Patient reports complaints of LBP during transfer supine -> sit.  Wife reports chronic history of LBP.  Patient rested at EOB, and patient repositioned in bed to decrease pain           PT Goals (current goals can now be found in the care plan section) Progress towards PT goals: Progressing toward goals    Frequency  Min 3X/week    PT Plan Current plan remains appropriate       End of Session Equipment Utilized During Treatment: Gait belt Activity Tolerance: Patient limited by lethargy Patient left: in bed;with call bell/phone within reach;with bed alarm set;with family/visitor present     Time: 5329-9242 PT Time Calculation (min): 14 min  Charges:  $Therapeutic Exercise: 8-22 mins                      Ricardo Schaefer 10/30/2013, 4:31 PM

## 2013-10-30 NOTE — Clinical Social Work Placement (Signed)
Clinical Social Work Department CLINICAL SOCIAL WORK PLACEMENT NOTE 10/30/2013  Patient:  Ricardo Schaefer, Ricardo Schaefer  Account Number:  [id number] Admit date:  10/27/2013  Clinical Social Worker:  Benay Pike, LCSW  Date/time:  10/29/2013 02:18 PM  Clinical Social Work is seeking post-discharge placement for this patient at the following level of care:   [place]   (*CSW will update this form in Epic as items are completed)   10/29/2013  Patient/family provided with [place] Department of Clinical Social Work's list of facilities offering this level of care within the geographic area requested by the patient (or if unable, by the patient's family).  10/29/2013  Patient/family informed of their freedom to choose among providers that offer the needed level of care, that participate in Medicare, Medicaid or managed care program needed by the patient, have an available bed and are willing to accept the patient.  10/29/2013  Patient/family informed of [facility]' ownership interest in [facility], as well as of the fact that they are under no obligation to receive care at this facility.  PASARR submitted to EDS on 10/29/2013 PASARR number received on 10/29/2013  FL2 transmitted to all facilities in geographic area requested by pt/family on  10/29/2013 FL2 transmitted to all facilities within larger geographic area on   Patient informed that his/her managed care company has contracts with or will negotiate with  certain facilities, including the following:     Patient/family informed of bed offers received:  10/30/2013 Patient chooses bed at [facility] Physician recommends and patient chooses bed at  [facility]  Patient to be transferred to  on   Patient to be transferred to facility by  Patient and family notified of transfer on  Name of family member notified:    The following physician request were entered in Epic:   Additional Comments:  Benay Pike,  [place]

## 2013-10-30 NOTE — Clinical Social Work Note (Signed)
Pt's wife states she brought advance directive for pt to hospital on Monday and left with staff at desk. She said this is the original and requested it back with a copy on the chart. CSW checked every chart on floor and no advance directive found. Wife notified and reports she does not remember who it was given to. She does have a copy of the living will at home, but that was the only HCPOA document. Pt's wife was named HCPOA. CSW apologized that document was not found, but reassured her that as next of kin she would be decision maker for pt. She was understanding. Offered to help complete HCPOA again, but wife does not feel that he is capable of doing this right now.   Benay Pike, [place]

## 2013-10-30 NOTE — Consult Note (Addendum)
Markeith Jue MRN: [id number] DOB/AGE: 1927/02/25 80 y.o. Primary Care Physician:PICKARD,WARREN TOM, MD Admit date: 10/27/2013 Chief Complaint:  Chief Complaint  Patient presents with  . Fatigue  . Fall   HPI: Pt is 80 year old male with past medical hx of Non Hodgkin lymphoma who presented to ER with c/o fall.  HPI dates back to 10/27/13 when pt came to ER after the fall. NO c/o head trauma No c/o loss of consciousness NO c/o chest pain  NO c/o dyspnea  NO c/o fever/cough./hills On presentation to ER pt was found to be hypercalcemic. Pt was admitted for further care. Pt seen today. Pt offers no new complaints    Past Medical History  Diagnosis Date  . Coronary atherosclerosis of native coronary artery     Occluded RCA with collaterals, DES to LAD and circumflex 2005  . Essential hypertension, benign   . Non Hodgkin's lymphoma   . Chronic renal insufficiency   . Hyperlipidemia   . Degenerative disc disease, lumbar   . Duodenal ulcer     EGD 6/11 - iron deficiency anemia  . Pericardial effusion     Status post window  . Carotid artery disease     Nonobstructive, less than 61% LICA by angiography 6/07  . Stroke     Occurred 1999, had left atrial appendage thrombus at that time - previously on Coumadin  . Peripheral arterial disease   . Enlarged prostate   . Lymphoma   . Melanoma         Family History  Problem Relation Age of Onset  . COPD Mother   . Coronary artery disease Mother     Social History:  reports that he has been smoking Cigarettes.  He has a 37.5 pack-year smoking history. He has never used smokeless tobacco. He reports that he does not drink alcohol or use illicit drugs.   Allergies:  Allergies  Allergen Reactions  . Aspirin     Causes stomach bleeds    Medications Prior to Admission  Medication Sig Dispense Refill  . ALPRAZolam (XANAX) 0.5 MG tablet Take 0.5 mg by mouth 4 (four) times daily.      . clopidogrel (PLAVIX) 75 MG tablet Take 75  mg by mouth daily with breakfast.      . doxazosin (CARDURA) 4 MG tablet Take 4 mg by mouth daily.      . hydrALAZINE (APRESOLINE) 25 MG tablet Take 50 mg by mouth daily.      Marland Kitchen HYDROcodone-acetaminophen (NORCO/VICODIN) 5-325 MG per tablet Take 1 tablet by mouth every 6 (six) hours as needed. pain      . lovastatin (MEVACOR) 40 MG tablet Take 40 mg by mouth at bedtime.      Marland Kitchen NIFEdipine (PROCARDIA XL/ADALAT-CC) 60 MG 24 hr tablet Take 60 mg by mouth daily.      . pantoprazole (PROTONIX) 40 MG tablet Take 40 mg by mouth daily.      . tamsulosin (FLOMAX) 0.4 MG CAPS capsule Take 0.4 mg by mouth at bedtime.      Marland Kitchen venlafaxine XR (EFFEXOR-XR) 150 MG 24 hr capsule Take 150 mg by mouth daily with breakfast.           PXT:GGYIR from the symptoms mentioned above,there are no other symptoms referable to all systems reviewed.  . ALPRAZolam  0.5 mg Oral QID  . clopidogrel  75 mg Oral Q breakfast  . docusate sodium  100 mg Oral BID  . doxazosin  4 mg Oral  Daily  . enoxaparin (LOVENOX) injection  30 mg Subcutaneous Q24H  . feeding supplement (ENSURE COMPLETE)  237 mL Oral BID BM  . hydrALAZINE  50 mg Oral 3 times per day  . NIFEdipine  90 mg Oral Daily  . pantoprazole  40 mg Oral Daily  . simvastatin  20 mg Oral q1800  . tamsulosin  0.4 mg Oral QHS  . venlafaxine XR  150 mg Oral Q breakfast      Physical Exam: Vital signs in last 24 hours: Temp:  [98.2 F (36.8 C)-99.3 F (37.4 C)] 98.3 F (36.8 C) (06/10 1437) Pulse Rate:  [71-118] 107 (06/10 1437) Resp:  [18-20] 18 (06/10 1437) BP: (158-216)/(43-75) 192/43 mmHg (06/10 1437) SpO2:  [95 %-99 %] 95 % (06/10 1437) FiO2 (%):  [2 %] 2 % (06/09 2059) Weight change:  Last BM Date: 10/28/13  Intake/Output from previous day: 06/09 0701 - 06/10 0700 In: 500 [P.O.:240; I.V.:675] Out: 100 [Urine:100]     Physical Exam: General- pt is awake,alert, oriented to time place and person Resp- No acute REsp distress, Decreased bs at  bases. CVS- S1S2 regular in rate and rhythm GIT- BS+, soft, NT, Distended EXT- NO LE Edema, Cyanosis CNS- CN 2-12 grossly intact. Moving all 4 extremities Psych- normal mood and affect    Lab Results: CBC  Recent Labs  10/29/13 0603 10/30/13 0546  WBC 5.9 7.2  HGB 11.3* 10.9*  HCT 36.3* 34.5*  PLT 220 206    BMET  Recent Labs  10/29/13 0603 10/30/13 0546  NA 146 144  K 4.1 4.0  CL 106 105  CO2 28 26  GLUCOSE 111* 109*  BUN 28* 25*  CREATININE 1.97* 1.88*  CALCIUM 11.6* 11.5*   Trend  Creat 2015  3.0=>1.97==>1.88 2014  1.8--2.1 2013   1.7   Calcium  2015 11.8=>11.6=>11.5 2014  10.8 2013  10.3--10.5  MICRO No results found for this or any previous visit (from the past 240 hour(s)).    Lab Results  Component Value Date   CALCIUM 11.5* 10/30/2013   PHOS 3.3 10/27/2013  Vitamin D 25- 31    Impression: 1)Renal  CKD stage 4 .               CKD since 2013( Most likley before that)               CKD secondary to HTN/ Age ass decline/ Hypercaclemia ass AKI ?                Progression of CKD slow                Proteinura will check.                 Hematuria  none.                Nephrolithiasis Hx Absent   2)HTN  BP high   Medication- On Calcium Channel Blockers On Beta blockers On Alpha Blockers   3)Anemia HGb at goal (9--11)   4)CKD Mineral-Bone Disorder PTH not avail  Secondary Hyperparathyroidism w/u pending. Phosphorus at goal. Vitamin 25-OH  at goal.  5)HyperCalcemia Etiology   Primary vs Secondary vsTertary Hyperparathyroidism?   Malignancy related?                Spep negative Haem following   6)Electrolytes Normokalemic NOrmonatremic   7)Acid base Co2 at goal     Plan:  Awaiting PTh results If Low will ask for PTH-rP  Will suggest to  Continue IVf Will add lasix 40mg  Daily this will help w Hypercalcemia and HTN      Chrisanne Loose S 10/30/2013, 3:14 PM

## 2013-10-31 ENCOUNTER — Inpatient Hospital Stay (HOSPITAL_COMMUNITY): Payer: Medicare Other

## 2013-10-31 DIAGNOSIS — R509 Fever, unspecified: Secondary | ICD-10-CM | POA: Diagnosis present

## 2013-10-31 DIAGNOSIS — J189 Pneumonia, unspecified organism: Secondary | ICD-10-CM | POA: Diagnosis present

## 2013-10-31 LAB — BASIC METABOLIC PANEL
BUN: 27 mg/dL — AB (ref 6–23)
CALCIUM: 11.4 mg/dL — AB (ref 8.4–10.5)
CHLORIDE: 104 meq/L (ref 96–112)
CO2: 25 mEq/L (ref 19–32)
CREATININE: 1.81 mg/dL — AB (ref 0.50–1.35)
GFR calc Af Amer: 38 mL/min — ABNORMAL LOW (ref >90)
GFR calc non Af Amer: 32 mL/min — ABNORMAL LOW (ref >90)
GLUCOSE: 98 mg/dL (ref 70–99)
Potassium: 3.9 mEq/L (ref 3.7–5.3)
Sodium: 142 mEq/L (ref 137–147)

## 2013-10-31 LAB — URINALYSIS, ROUTINE W REFLEX MICROSCOPIC
BILIRUBIN URINE: NEGATIVE
Glucose, UA: NEGATIVE mg/dL
HGB URINE DIPSTICK: NEGATIVE
Ketones, ur: NEGATIVE mg/dL
Leukocytes, UA: NEGATIVE
Nitrite: NEGATIVE
PROTEIN: NEGATIVE mg/dL
SPECIFIC GRAVITY, URINE: 1.025 (ref 1.005–1.030)
Urobilinogen, UA: 0.2 mg/dL (ref 0.0–1.0)
pH: 5.5 (ref 5.0–8.0)

## 2013-10-31 LAB — VITAMIN D 1,25 DIHYDROXY
VITAMIN D3 1, 25 (OH): 73 pg/mL
Vitamin D 1, 25 (OH)2 Total: 73 pg/mL — ABNORMAL HIGH (ref 18–72)

## 2013-10-31 LAB — PTH, INTACT AND CALCIUM
Calcium, Total (PTH): 11.4 mg/dL — ABNORMAL HIGH (ref 8.4–10.5)
PTH: 5.5 pg/mL — ABNORMAL LOW (ref 14.0–72.0)

## 2013-10-31 MED ORDER — VANCOMYCIN HCL 1000 MG IV SOLR
INTRAVENOUS | Status: AC
  Filled 2013-10-31: qty 1000

## 2013-10-31 MED ORDER — DEXTROSE 5 % IV SOLN
INTRAVENOUS | Status: AC
  Filled 2013-10-31: qty 1

## 2013-10-31 MED ORDER — DEXTROSE 5 % IV SOLN
1.0000 g | INTRAVENOUS | Status: DC
  Administered 2013-10-31 – 2013-11-03 (×4): 1 g via INTRAVENOUS
  Filled 2013-10-31 (×5): qty 1

## 2013-10-31 MED ORDER — FUROSEMIDE 10 MG/ML IJ SOLN
40.0000 mg | Freq: Two times a day (BID) | INTRAMUSCULAR | Status: DC
  Administered 2013-10-31 – 2013-11-05 (×11): 40 mg via INTRAVENOUS
  Filled 2013-10-31 (×11): qty 4

## 2013-10-31 MED ORDER — VANCOMYCIN HCL IN DEXTROSE 750-5 MG/150ML-% IV SOLN
INTRAVENOUS | Status: AC
  Filled 2013-10-31: qty 150

## 2013-10-31 MED ORDER — VANCOMYCIN HCL IN DEXTROSE 1-5 GM/200ML-% IV SOLN
1000.0000 mg | Freq: Once | INTRAVENOUS | Status: AC
  Administered 2013-10-31: 1000 mg via INTRAVENOUS
  Filled 2013-10-31: qty 200

## 2013-10-31 MED ORDER — FUROSEMIDE 10 MG/ML IJ SOLN
40.0000 mg | Freq: Two times a day (BID) | INTRAMUSCULAR | Status: DC
  Filled 2013-10-31: qty 4

## 2013-10-31 MED ORDER — VANCOMYCIN HCL IN DEXTROSE 750-5 MG/150ML-% IV SOLN
750.0000 mg | INTRAVENOUS | Status: DC
  Administered 2013-10-31 – 2013-11-03 (×4): 750 mg via INTRAVENOUS
  Filled 2013-10-31 (×5): qty 150

## 2013-10-31 NOTE — Progress Notes (Signed)
[facility] PROGRESS NOTE  Ricardo Schaefer IEP:[id number] DOB: 08/02/1926 DOA: 10/27/2013 PCP: [name], MD  Assessment/Plan: 1. Failure to Thrive/Functional Decline -Initial workup unremarkable so far. He had a negative head CT, CXR did not show infiltrate, u/a was unremarkable, white count 5.6, hemaglobin 11.3, no source of infection, nonfocal exam.  -Had MRI on 10/28/2013 which did not show acute changes.  -Pending labs include PTH, SPEP, UPEP results are back.  -Continue IV fluids, intact PTH is low.  -PT consultation placed, may benefit from rehab at SNF  2. Hypercalcemia -Calcium at 11.4,  -Possibly related to CKD and hyperparathyroidism -Continue IV fluids   3.  Murmur -He appeared to have a more pronounced harsh systolic murmur on admission.  -Recent 2D Echo on 08/29/2013 showed EF of 60-65% without significant vavular disease,aortic valve sclerosis without stenosis.  -Repeat Echo show EF of 75%. With grade 1 diastolic dysfunction. Mild LVH.   4. HTN -Blood pressures remain elevated. I increased his Hydralazine to 50 mg PO TID from q daily dosing yesterday. -Will increase Nifedipine to 90 mg PO q daily -Continue PRN Labetolol   5.  Low Grade B-Cell Lymphoma -Medical oncology consulted, stable   6. Stage III CKD -Creatinine stable AND BETTER THAN baseline.  -Will continue IV fluids  7. ICA stenosis -Patient having carotid dopplers 10/20/2011 showing right ICA stenosis 50-69%, left ICA stenosis 70% stenosis.  -MRI on this admission did not show acute CVA. Nonfocal exam, appears to have presented with generalized weakness.  -Outpatient vascular surgery follow, has seen Dr Donnetta Hutching in the clinic.   Fever: - CXR portable showed right basilar infiltrate. Started the patient on IV vancomycin and IV cefepime.  - blood cultures are drawn.   DVT prophylaxis.   Code Status: DNR Family Communication: none at bedside.  Disposition Plan: PT consult, may benefit from SNF  placement    HPI/Subjective: Patient is a 80 y/o with a past medical history of CAD, HTN, Non Hodgkin's Lymphoma, presenting with failure to thrive/functional decline, generalize weakness, having fall at home, admitted to the medicine service on 10/28/2013. During this hospitalization he was seen by medical oncology who felt he was stable in regards to his follicular lymphoma and did not recommend further interventions. He seemed to have a more pronounced murmur, a 2D echo was done on 08/29/2013 showed an EF of 60 to 65%, sclerotic aortic valve without stenosis. Repeat echo is pending.  He was given IV fluids as he was felt to be dehydrated. Patient seemed to improve with this as he was able to get up out of bed and ambulate in his room. His wife also reported that there was an improvement to his appetite. Labs did not show infectious source.   Objective: Filed Vitals:   10/31/13 1533  BP: 173/72  Pulse: 96  Temp:   Resp:     Intake/Output Summary (Last 24 hours) at 10/31/13 1758 Last data filed at 10/31/13 1730  Gross per 24 hour  Intake 2366.67 ml  Output      0 ml  Net 2366.67 ml   Filed Weights   10/27/13 2130  Weight: 71.215 kg (157 lb)    Exam:   General:  Chronically ill appearing  Cardiovascular: 3/6 harsh systolic murmur, normal Y6A6  Respiratory: Clear to auscultation bilaterally  Abdomen: Soft, nontender, nondistended  Musculoskeletal: Trace edema to lower extremities  Data Reviewed: Basic Metabolic Panel:  Recent Labs Lab 10/27/13 2244 10/28/13 0544 10/29/13 0603 10/30/13 0546 10/30/13 0728 10/31/13  1105  NA 141 144 146 144  --  142  K 4.2 3.9 4.1 4.0  --  3.9  CL 100 104 106 105  --  104  CO2 27 25 28 26   --  25  GLUCOSE 100* 86 111* 109*  --  98  BUN 31* 29* 28* 25*  --  27*  CREATININE 2.05* 1.97* 1.97* 1.88*  --  1.81*  CALCIUM 11.8* 11.5* 11.6* 11.5* 11.4* 11.4*  PHOS 3.3  --   --   --   --   --    Liver Function Tests:  Recent Labs Lab  10/27/13 2244 10/28/13 0544  AST 26 26  ALT 10 8  ALKPHOS 85 83  BILITOT 0.2* 0.2*  PROT 6.2 6.1  ALBUMIN 3.1* 3.0*   No results found for this basename: LIPASE, AMYLASE,  in the last 168 hours No results found for this basename: AMMONIA,  in the last 168 hours CBC:  Recent Labs Lab 10/27/13 2244 10/28/13 0711 10/29/13 0603 10/30/13 0546  WBC 5.7 5.6 5.9 7.2  NEUTROABS 3.9  --   --   --   HGB 11.5* 11.3* 11.3* 10.9*  HCT 35.7* 35.5* 36.3* 34.5*  MCV 87.3 88.3 89.0 88.9  PLT 197 209 220 206   Cardiac Enzymes:  Recent Labs Lab 10/27/13 2244  TROPONINI <0.30   BNP (last 3 results) No results found for this basename: PROBNP,  in the last 8760 hours CBG: No results found for this basename: GLUCAP,  in the last 168 hours  Recent Results (from the past 240 hour(s))  CULTURE, BLOOD (ROUTINE X 2)     Status: None   Collection Time    10/31/13 11:05 AM      Result Value Ref Range Status   Specimen Description BLOOD RIGHT ARM   Final   Special Requests BOTTLES DRAWN AEROBIC AND ANAEROBIC 10CC BOTTLES   Final   Culture NO GROWTH <24 HRS   Final   Report Status PENDING   Incomplete  CULTURE, BLOOD (ROUTINE X 2)     Status: None   Collection Time    10/31/13 11:14 AM      Result Value Ref Range Status   Specimen Description BLOOD RIGHT HAND   Final   Special Requests BOTTLES DRAWN AEROBIC AND ANAEROBIC 8CC BOTTLES   Final   Culture NO GROWTH <24 HRS   Final   Report Status PENDING   Incomplete     Studies: Dg Chest Port 1 View  10/31/2013   CLINICAL DATA:  Fever  EXAM: PORTABLE CHEST - 1 VIEW  COMPARISON:  10/27/2013  FINDINGS: Cardiomediastinal silhouette is stable. No pulmonary edema. There is streaky right basilar atelectasis or infiltrate.  IMPRESSION: No pulmonary edema. Streaky right basilar atelectasis or infiltrate.   Electronically Signed   By: Lahoma Crocker M.D.   On: 10/31/2013 11:55    Scheduled Meds: . ALPRAZolam  0.5 mg Oral QID  . clopidogrel  75 mg Oral  Q breakfast  . docusate sodium  100 mg Oral BID  . doxazosin  4 mg Oral Daily  . enoxaparin (LOVENOX) injection  30 mg Subcutaneous Q24H  . feeding supplement (ENSURE COMPLETE)  237 mL Oral BID BM  . furosemide  40 mg Intravenous BID  . hydrALAZINE  50 mg Oral 3 times per day  . NIFEdipine  90 mg Oral Daily  . pantoprazole  40 mg Oral Daily  . simvastatin  20 mg Oral q1800  .  tamsulosin  0.4 mg Oral QHS  . venlafaxine XR  150 mg Oral Q breakfast   Continuous Infusions: . sodium chloride 125 mL/hr at 10/31/13 0840    Principal Problem:   Weakness Active Problems:   Coronary atherosclerosis of native coronary artery   Essential hypertension, benign   Peripheral arterial disease   Chronic kidney disease, stage III (moderate)   Low-Grade, B-Cell, Follicular lymphoma   Hypercalcemia    Time spent: 35 min   [facility] Pager [phone]. If 7PM-7AM, please contact night-coverage at [email or web address], password [facility] 10/31/2013, 5:58 PM  LOS: 4 days

## 2013-10-31 NOTE — Progress Notes (Signed)
ANTIBIOTIC CONSULT NOTE - INITIAL  Pharmacy Consult for Vancomycin and Cefepime  Indication: pneumonia  Allergies  Allergen Reactions  . Aspirin     Causes stomach bleeds    Patient Measurements: Height: 5\' 8"  (172.7 cm) Weight: 157 lb (71.215 kg) IBW/kg (Calculated) : 68.4   Vital Signs: Temp: 98.3 F (36.8 C) (06/11 1500) Temp src: Oral (06/11 1500) BP: 148/54 mmHg (06/11 1821) Pulse Rate: 96 (06/11 1821) Intake/Output from previous day: 06/10 0701 - 06/11 0700 In: 1862.5 [I.V.:1862.5] Out: 450 [Urine:450] Intake/Output from this shift:    Labs:  Recent Labs  10/29/13 0603 10/30/13 0546 10/31/13 1105  WBC 5.9 7.2  --   HGB 11.3* 10.9*  --   PLT 220 206  --   CREATININE 1.97* 1.88* 1.81*   Estimated Creatinine Clearance: 28.9 ml/min (by C-G formula based on Cr of 1.81). No results found for this basename: VANCOTROUGH, Corlis Leak, VANCORANDOM, GENTTROUGH, GENTPEAK, GENTRANDOM, TOBRATROUGH, TOBRAPEAK, TOBRARND, AMIKACINPEAK, AMIKACINTROU, AMIKACIN,  in the last 72 hours   Microbiology: Recent Results (from the past 720 hour(s))  CULTURE, BLOOD (ROUTINE X 2)     Status: None   Collection Time    10/31/13 11:05 AM      Result Value Ref Range Status   Specimen Description BLOOD RIGHT ARM   Final   Special Requests BOTTLES DRAWN AEROBIC AND ANAEROBIC 10CC BOTTLES   Final   Culture NO GROWTH <24 HRS   Final   Report Status PENDING   Incomplete  CULTURE, BLOOD (ROUTINE X 2)     Status: None   Collection Time    10/31/13 11:14 AM      Result Value Ref Range Status   Specimen Description BLOOD RIGHT HAND   Final   Special Requests BOTTLES DRAWN AEROBIC AND ANAEROBIC 8CC BOTTLES   Final   Culture NO GROWTH <24 HRS   Final   Report Status PENDING   Incomplete    Medical History: Past Medical History  Diagnosis Date  . Coronary atherosclerosis of native coronary artery     Occluded RCA with collaterals, DES to LAD and circumflex 2005  . Essential hypertension,  benign   . Non Hodgkin's lymphoma   . Chronic renal insufficiency   . Hyperlipidemia   . Degenerative disc disease, lumbar   . Duodenal ulcer     EGD 6/11 - iron deficiency anemia  . Pericardial effusion     Status post window  . Carotid artery disease     Nonobstructive, less than 37% LICA by angiography 8/58  . Stroke     Occurred 1999, had left atrial appendage thrombus at that time - previously on Coumadin  . Peripheral arterial disease   . Enlarged prostate   . Lymphoma   . Melanoma     Medications:  Scheduled:  . ALPRAZolam  0.5 mg Oral QID  . clopidogrel  75 mg Oral Q breakfast  . docusate sodium  100 mg Oral BID  . doxazosin  4 mg Oral Daily  . enoxaparin (LOVENOX) injection  30 mg Subcutaneous Q24H  . feeding supplement (ENSURE COMPLETE)  237 mL Oral BID BM  . furosemide  40 mg Intravenous BID  . hydrALAZINE  50 mg Oral 3 times per day  . NIFEdipine  90 mg Oral Daily  . pantoprazole  40 mg Oral Daily  . simvastatin  20 mg Oral q1800  . tamsulosin  0.4 mg Oral QHS  . venlafaxine XR  150 mg Oral Q breakfast  Assessment: Okay for Protocol, Fever:  CXR portable showed right basilar infiltrate to be started on IV vancomycin and IV cefepime. 80 y/o with a past medical history of CAD, HTN, Non Hodgkin's Lymphoma, presenting with failure to thrive/functional decline, generalize weakness, having fall at home, admitted to the medicine service on 10/28/2013.  Vancomycin 6/11 >> Cefepime 6/11 >>  Goal of Therapy:  Vancomycin trough level 15-20 mcg/ml  Plan:  Vancomycin 1gm IV x 1, then 750mg  IV every 24 hours. Cefepime 1gm IV every 24 hours. Measure antibiotic drug levels at steady state Follow up culture results  Pricilla Larsson 10/31/2013,7:37 PM

## 2013-10-31 NOTE — Progress Notes (Addendum)
Subjective: Interval History: has no complaint . Patient denies any difficulty breathing..  Objective: Vital signs in last 24 hours: Temp:  [98.3 F (36.8 C)-100.4 F (38 C)] 98.5 F (36.9 C) (06/11 0525) Pulse Rate:  [86-107] 89 (06/11 0525) Resp:  [18-20] 20 (06/11 0525) BP: (142-192)/(43-75) 142/66 mmHg (06/11 0525) SpO2:  [92 %-97 %] 92 % (06/11 0525) Weight change:   Intake/Output from previous day: 06/10 0701 - 06/11 0700 In: 1862.5 [I.V.:1862.5] Out: 450 [Urine:450] Intake/Output this shift:    General appearance: alert, cooperative and no distress Resp: clear to auscultation bilaterally Cardio: regular rate and rhythm, S1, S2 normal, no murmur, click, rub or gallop GI: soft, non-tender; bowel sounds normal; no masses,  no organomegaly Extremities: extremities normal, atraumatic, no cyanosis or edema  Lab Results:  Recent Labs  10/29/13 0603 10/30/13 0546  WBC 5.9 7.2  HGB 11.3* 10.9*  HCT 36.3* 34.5*  PLT 220 206   BMET:  Recent Labs  10/29/13 0603 10/30/13 0546  NA 146 144  K 4.1 4.0  CL 106 105  CO2 28 26  GLUCOSE 111* 109*  BUN 28* 25*  CREATININE 1.97* 1.88*  CALCIUM 11.6* 11.5*   No results found for this basename: PTH,  in the last 72 hours Iron Studies: No results found for this basename: IRON, TIBC, TRANSFERRIN, FERRITIN,  in the last 72 hours  Studies/Results: No results found.  I have reviewed the patient's current medications.  Assessment/Plan: Problem #1 acute kidney injury superimposed on chronic. His BUN and creatinine is improving. Problem #2 hypercalcemia: His calcium is still high. Presently his 58, vitamin D level is normal however his 1,25 vitamin D is slightly high.  intact PTH is pending. Possibly secondary to his Lymphoma? Problem #3 history of coronary artery disease Problem #4 chronic renal failure: Stage III. Problem #5 proteinuria: None nephrotic range. Patient was elevated Kappa to Lambda ratio. Since patient has  lymphoma may be dealing with paraproteinemias such as light chain deposition disease. Problem #6 peripheral vascular disease Problem #7 hypertension: His blood pressure is fluctuating is stable. Plan: We'll increase his IV fluid to125 cc per hour We'll continue with Lasix 40 mg IV twice a day We'll check PTH related peptide We'll follow his basic metabolic panel. If his calcium is persistently high we may use Pamdronate    LOS: 4 days   Puneet Masoner S 10/31/2013,8:00 AM

## 2013-11-01 DIAGNOSIS — J189 Pneumonia, unspecified organism: Secondary | ICD-10-CM

## 2013-11-01 DIAGNOSIS — E440 Moderate protein-calorie malnutrition: Secondary | ICD-10-CM | POA: Diagnosis present

## 2013-11-01 LAB — BASIC METABOLIC PANEL
BUN: 29 mg/dL — AB (ref 6–23)
CALCIUM: 10.8 mg/dL — AB (ref 8.4–10.5)
CO2: 26 mEq/L (ref 19–32)
Chloride: 105 mEq/L (ref 96–112)
Creatinine, Ser: 1.87 mg/dL — ABNORMAL HIGH (ref 0.50–1.35)
GFR calc non Af Amer: 31 mL/min — ABNORMAL LOW (ref >90)
GFR, EST AFRICAN AMERICAN: 36 mL/min — AB (ref >90)
Glucose, Bld: 93 mg/dL (ref 70–99)
Potassium: 3.8 mEq/L (ref 3.7–5.3)
Sodium: 143 mEq/L (ref 137–147)

## 2013-11-01 LAB — PARATHYROID HORMONE, INTACT (NO CA): PTH: 7 pg/mL — AB (ref 14.0–72.0)

## 2013-11-01 LAB — CBC
HEMATOCRIT: 31.8 % — AB (ref 39.0–52.0)
Hemoglobin: 9.9 g/dL — ABNORMAL LOW (ref 13.0–17.0)
MCH: 27.4 pg (ref 26.0–34.0)
MCHC: 31.1 g/dL (ref 30.0–36.0)
MCV: 88.1 fL (ref 78.0–100.0)
PLATELETS: 221 10*3/uL (ref 150–400)
RBC: 3.61 MIL/uL — ABNORMAL LOW (ref 4.22–5.81)
RDW: 14.3 % (ref 11.5–15.5)
WBC: 9.6 10*3/uL (ref 4.0–10.5)

## 2013-11-01 LAB — PHOSPHORUS: Phosphorus: 3.2 mg/dL (ref 2.3–4.6)

## 2013-11-01 MED ORDER — LABETALOL HCL 200 MG PO TABS
200.0000 mg | ORAL_TABLET | Freq: Two times a day (BID) | ORAL | Status: DC
  Administered 2013-11-01 – 2013-11-05 (×9): 200 mg via ORAL
  Filled 2013-11-01 (×9): qty 1

## 2013-11-01 MED ORDER — NYSTATIN 100000 UNIT/ML MT SUSP
5.0000 mL | Freq: Three times a day (TID) | OROMUCOSAL | Status: DC
  Administered 2013-11-01 – 2013-11-05 (×14): 500000 [IU] via ORAL
  Filled 2013-11-01 (×13): qty 5

## 2013-11-01 MED ORDER — MAGNESIUM HYDROXIDE 400 MG/5ML PO SUSP
30.0000 mL | Freq: Every day | ORAL | Status: DC | PRN
  Administered 2013-11-01 – 2013-11-03 (×2): 30 mL via ORAL
  Filled 2013-11-01 (×2): qty 30

## 2013-11-01 NOTE — Plan of Care (Signed)
Problem: Phase I Progression Outcomes Goal: OOB as tolerated unless otherwise ordered Outcome: Progressing Patient is OOB to chair.

## 2013-11-01 NOTE — Progress Notes (Addendum)
[facility] PROGRESS NOTE  Ricardo Schaefer GYI:[id number] DOB: 1926/12/23 DOA: 10/27/2013 PCP: [name], MD  Assessment/Plan: 1. Failure to Thrive/Functional Decline -Initial workup unremarkable so far. He had a negative head CT, CXR did not show infiltrate, u/a was unremarkable, white count 5.6, hemaglobin 11.3, no source of infection, nonfocal exam.  -Had MRI on 10/28/2013 which did not show acute changes.  -Pending labs include PTH, SPEP, UPEP results are back.  -Continue IV fluids, intact PTH is low.  -PT consultation placed, may benefit from rehab at SNF  2. Hypercalcemia -Calcium at 10.5  Improving.  -Possibly related to CKD and hyperparathyroidism -Continue IV fluids   3.  Murmur -He appeared to have a more pronounced harsh systolic murmur on admission.  -Recent 2D Echo on 08/29/2013 showed EF of 60-65% without significant vavular disease,aortic valve sclerosis without stenosis.  -Repeat Echo show EF of 75%. With grade 1 diastolic dysfunction. Mild LVH.   4. HTN -Blood pressures remain elevated. I increased his Hydralazine to 50 mg PO TID from q daily dosing yesterday. -Will increase Nifedipine to 90 mg PO q daily -Continue PRN Labetolol   5.  Low Grade B-Cell Lymphoma -Medical oncology consulted, stable   6. Stage III CKD -Creatinine stable AND BETTER THAN baseline.  -Will continue IV fluids  7. ICA stenosis -Patient having carotid dopplers 10/20/2011 showing right ICA stenosis 50-69%, left ICA stenosis 70% stenosis.  -MRI on this admission did not show acute CVA. Nonfocal exam, appears to have presented with generalized weakness.  -Outpatient vascular surgery follow, has seen Dr Donnetta Hutching in the clinic.   Fever: - CXR portable showed right basilar infiltrate. Started the patient on IV vancomycin and IV cefepime.  - blood cultures are drawn.   Moderate malnutrition: Nutrition consulted and recommendations given.   DVT prophylaxis.   Code Status: DNR Family  Communication: none at bedside.  Disposition Plan: PT consult, may benefit from SNF placement    HPI/Subjective: Patient is a 80 y/o with a past medical history of CAD, HTN, Non Hodgkin's Lymphoma, presenting with failure to thrive/functional decline, generalize weakness, having fall at home, admitted to the medicine service on 10/28/2013. During this hospitalization he was seen by medical oncology who felt he was stable in regards to his follicular lymphoma and did not recommend further interventions. He seemed to have a more pronounced murmur, a 2D echo was done on 08/29/2013 showed an EF of 60 to 65%, sclerotic aortic valve without stenosis. Repeat echo is pending.  He was given IV fluids as he was felt to be dehydrated. Patient seemed to improve with this as he was able to get up out of bed and ambulate in his room. His wife also reported that there was an improvement to his appetite. Labs did not show infectious source.   Objective: Filed Vitals:   11/01/13 1524  BP: 169/55  Pulse: 91  Temp: 98 F (36.7 C)  Resp: 20    Intake/Output Summary (Last 24 hours) at 11/01/13 1717 Last data filed at 11/01/13 1533  Gross per 24 hour  Intake 4274.17 ml  Output    300 ml  Net 3974.17 ml   Filed Weights   10/27/13 2130  Weight: 71.215 kg (157 lb)    Exam:   General:  Chronically ill appearing  Cardiovascular: 3/6 harsh systolic murmur, normal J5K0  Respiratory: Clear to auscultation bilaterally  Abdomen: Soft, nontender, nondistended  Musculoskeletal: Trace edema to lower extremities  Data Reviewed: Basic Metabolic Panel:  Recent Labs  Lab 10/27/13 2244 10/28/13 0544 10/29/13 0603 10/30/13 0546 10/30/13 0728 10/31/13 1105 11/01/13 0640  NA 141 144 146 144  --  142 143  K 4.2 3.9 4.1 4.0  --  3.9 3.8  CL 100 104 106 105  --  104 105  CO2 27 25 28 26   --  25 26  GLUCOSE 100* 86 111* 109*  --  98 93  BUN 31* 29* 28* 25*  --  27* 29*  CREATININE 2.05* 1.97* 1.97* 1.88*   --  1.81* 1.87*  CALCIUM 11.8* 11.5* 11.6* 11.5* 11.4* 11.4* 10.8*  PHOS 3.3  --   --   --   --   --  3.2   Liver Function Tests:  Recent Labs Lab 10/27/13 2244 10/28/13 0544  AST 26 26  ALT 10 8  ALKPHOS 85 83  BILITOT 0.2* 0.2*  PROT 6.2 6.1  ALBUMIN 3.1* 3.0*   No results found for this basename: LIPASE, AMYLASE,  in the last 168 hours No results found for this basename: AMMONIA,  in the last 168 hours CBC:  Recent Labs Lab 10/27/13 2244 10/28/13 0711 10/29/13 0603 10/30/13 0546 11/01/13 0640  WBC 5.7 5.6 5.9 7.2 9.6  NEUTROABS 3.9  --   --   --   --   HGB 11.5* 11.3* 11.3* 10.9* 9.9*  HCT 35.7* 35.5* 36.3* 34.5* 31.8*  MCV 87.3 88.3 89.0 88.9 88.1  PLT 197 209 220 206 221   Cardiac Enzymes:  Recent Labs Lab 10/27/13 2244  TROPONINI <0.30   BNP (last 3 results) No results found for this basename: PROBNP,  in the last 8760 hours CBG: No results found for this basename: GLUCAP,  in the last 168 hours  Recent Results (from the past 240 hour(s))  CULTURE, BLOOD (ROUTINE X 2)     Status: None   Collection Time    10/31/13 11:05 AM      Result Value Ref Range Status   Specimen Description BLOOD RIGHT ARM   Final   Special Requests BOTTLES DRAWN AEROBIC AND ANAEROBIC 10CC BOTTLES   Final   Culture NO GROWTH <24 HRS   Final   Report Status PENDING   Incomplete  CULTURE, BLOOD (ROUTINE X 2)     Status: None   Collection Time    10/31/13 11:14 AM      Result Value Ref Range Status   Specimen Description BLOOD RIGHT HAND   Final   Special Requests BOTTLES DRAWN AEROBIC AND ANAEROBIC 8CC BOTTLES   Final   Culture NO GROWTH <24 HRS   Final   Report Status PENDING   Incomplete     Studies: Dg Chest Port 1 View  10/31/2013   CLINICAL DATA:  Fever  EXAM: PORTABLE CHEST - 1 VIEW  COMPARISON:  10/27/2013  FINDINGS: Cardiomediastinal silhouette is stable. No pulmonary edema. There is streaky right basilar atelectasis or infiltrate.  IMPRESSION: No pulmonary edema.  Streaky right basilar atelectasis or infiltrate.   Electronically Signed   By: Lahoma Crocker M.D.   On: 10/31/2013 11:55    Scheduled Meds: . ALPRAZolam  0.5 mg Oral QID  . ceFEPime (MAXIPIME) IV  1 g Intravenous Q24H  . clopidogrel  75 mg Oral Q breakfast  . docusate sodium  100 mg Oral BID  . doxazosin  4 mg Oral Daily  . enoxaparin (LOVENOX) injection  30 mg Subcutaneous Q24H  . feeding supplement (ENSURE COMPLETE)  237 mL Oral BID BM  .  furosemide  40 mg Intravenous BID  . hydrALAZINE  50 mg Oral 3 times per day  . labetalol  200 mg Oral BID  . NIFEdipine  90 mg Oral Daily  . pantoprazole  40 mg Oral Daily  . simvastatin  20 mg Oral q1800  . tamsulosin  0.4 mg Oral QHS  . vancomycin  750 mg Intravenous Q24H  . venlafaxine XR  150 mg Oral Q breakfast   Continuous Infusions: . sodium chloride 125 mL/hr at 10/31/13 1807    Principal Problem:   Weakness Active Problems:   Coronary atherosclerosis of native coronary artery   Essential hypertension, benign   Peripheral arterial disease   Chronic kidney disease, stage III (moderate)   Low-Grade, B-Cell, Follicular lymphoma   Hypercalcemia   HCAP (healthcare-associated pneumonia)   Fever, unspecified    Time spent: 35 min   [facility] Pager [phone]. If 7PM-7AM, please contact night-coverage at [email or web address], password [facility] 11/01/2013, 5:17 PM  LOS: 5 days

## 2013-11-01 NOTE — Progress Notes (Signed)
Physical Therapy Treatment Patient Details Name: Ricardo Schaefer MRN: [id number] DOB: 1927-01-11 Today's Date: 11/01/2013    History of Present Illness 80 year old male who has a past medical history of Coronary atherosclerosis of native coronary artery; Essential hypertension, benign; Non Hodgkin's lymphoma; Chronic renal insufficiency; Hyperlipidemia; Degenerative disc disease, lumbar; Duodenal ulcer; Pericardial effusion; Carotid artery disease; Stroke; Peripheral arterial disease; Enlarged prostate; Lymphoma; and Melanoma.    PT Comments    Patient more cooperative with therapy today, with increased participation in exercises and willingness to transfer to chair.  Patient still requires mod assistance and use of RW with sit <-> stand transfer, however was able to complete transfer without bed raised today demonstrating improvement.  Min assist required with stand pivot transfer to chair for navigation of RW in closed environment, though patient able to maintain balance and clear feet without assistance.  Once in chair, patient requested immediate return to bed secondary to fatigue.  Educated patient on importance of OOB activity to increase interaction with environment, increase activity tolerance, and reduce risk of decline.  Patient agreed to stay in chair for a little while and notify nursing when he wanted to return to bed.  Nurse tech aware of patient in chair, and aware patient would like to go back to bed before lunch.    Follow Up Recommendations  SNF     Equipment Recommendations  Rolling walker with 5" wheels           Mobility  Bed Mobility Overal bed mobility: Needs Assistance Bed Mobility: Supine to Sit     Supine to sit: HOB elevated;Min assist;Mod assist (Min assist for LE, mod assist for trunk)        Transfers Overall transfer level: Needs assistance Equipment used: Rolling walker (2 wheeled) Transfers: Sit to/from [facility] Sit to Stand: Mod  assist Stand pivot transfers: Min guard;Min assist (Min assist for RW navigation in closed environment)       General transfer comment: Patient was able to transfer to chair today, and did not require bed elevated                  Cognition Arousal/Alertness: Awake/alert Behavior During Therapy: WFL for tasks assessed/performed Overall Cognitive Status: Within Functional Limits for tasks assessed                      Exercises General Exercises - Lower Extremity Ankle Circles/Pumps: AROM;Both;20 reps;Supine Heel Slides: AROM;AAROM;Both;20 reps;Supine Hip ABduction/ADduction: AAROM;20 reps;Supine        Pertinent Vitals/Pain No pain reported.            PT Goals (current goals can now be found in the care plan section) Progress towards PT goals: Progressing toward goals    Frequency  Min 3X/week    PT Plan Current plan remains appropriate       End of Session Equipment Utilized During Treatment: Gait belt;Oxygen Activity Tolerance: Patient limited by fatigue Patient left: in chair;with call bell/phone within reach;with chair alarm set     Time: 2585-2778 PT Time Calculation (min): 29 min  Charges:  $Therapeutic Exercise: 8-22 mins $Therapeutic Activity: 8-22 mins                     Joeangel Jeanpaul 11/01/2013, 10:33 AM

## 2013-11-01 NOTE — Progress Notes (Signed)
NUTRITION FOLLOW UP  Intervention:   Continue with current nutrition plan of care  Nutrition Dx:   Inadequate oral intake related to decreased appetite as evidenced by pt diet hx and observed (0-15%) meals; progressing  Goal:   Pt to meet >/= 90% of their estimated nutrition needs; progressing  Monitor:   Po intake, labs and wt trends   Assessment:   Per MD notes, pt continues to improve clinically due to IVFs. He is expected to stay in the hospital over the weekend; will d/c to [facility] when medically stable.  Pt intake has improved greatly; PO: 75%. He is also taking supplements well- pt consuming approximately 75% of Ensure Complete supplements (noted 2 refusals per RN).   Height: Ht Readings from Last 1 Encounters:  10/27/13 5\' 8"  (1.727 m)    Weight Status:   Wt Readings from Last 1 Encounters:  10/27/13 157 lb (71.215 kg)    Re-estimated needs:  Kcal: 1775-2130  Protein: 85-92 gr  Fluid: 1.8-2.1 liters daily  Skin: left and rt arm ecchymosis  Diet Order: Cardiac   Intake/Output Summary (Last 24 hours) at 11/01/13 1303 Last data filed at 11/01/13 1023  Gross per 24 hour  Intake 4514.17 ml  Output    150 ml  Net 4364.17 ml    Last BM: 11/01/13   Labs:   Recent Labs Lab 10/27/13 2244  10/30/13 0546 10/30/13 0728 10/31/13 1105 11/01/13 0640  NA 141  < > 144  --  142 143  K 4.2  < > 4.0  --  3.9 3.8  CL 100  < > 105  --  104 105  CO2 27  < > 26  --  25 26  BUN 31*  < > 25*  --  27* 29*  CREATININE 2.05*  < > 1.88*  --  1.81* 1.87*  CALCIUM 11.8*  < > 11.5* 11.4* 11.4* 10.8*  PHOS 3.3  --   --   --   --  3.2  GLUCOSE 100*  < > 109*  --  98 93  < > = values in this interval not displayed.  CBG (last 3)  No results found for this basename: GLUCAP,  in the last 72 hours  Scheduled Meds: . ALPRAZolam  0.5 mg Oral QID  . ceFEPime (MAXIPIME) IV  1 g Intravenous Q24H  . clopidogrel  75 mg Oral Q breakfast  . docusate sodium  100 mg Oral BID  .  doxazosin  4 mg Oral Daily  . enoxaparin (LOVENOX) injection  30 mg Subcutaneous Q24H  . feeding supplement (ENSURE COMPLETE)  237 mL Oral BID BM  . furosemide  40 mg Intravenous BID  . hydrALAZINE  50 mg Oral 3 times per day  . labetalol  200 mg Oral BID  . NIFEdipine  90 mg Oral Daily  . pantoprazole  40 mg Oral Daily  . simvastatin  20 mg Oral q1800  . tamsulosin  0.4 mg Oral QHS  . vancomycin  750 mg Intravenous Q24H  . venlafaxine XR  150 mg Oral Q breakfast    Continuous Infusions: . sodium chloride 125 mL/hr at 10/31/13 1807    Talynn Lebon A. Jimmye Norman, RD, LDN Pager: [phone]

## 2013-11-01 NOTE — Progress Notes (Signed)
While administering the patient his medication I noticed that he has a very thick white/gray coat of  thrush on his tongue. Asked the MD if we could  get magic mouth wash.  Will pass on report to the oncoming nurse.

## 2013-11-01 NOTE — Progress Notes (Signed)
Subjective: Interval History: has no complaint . Patient denies any difficulty breathing.His appetite is good   Objective: Vital signs in last 24 hours: Temp:  [98.3 F (36.8 C)-98.7 F (37.1 C)] 98.5 F (36.9 C) (06/12 0624) Pulse Rate:  [55-106] 101 (06/12 0624) Resp:  [20] 20 (06/12 0624) BP: (147-195)/(48-72) 176/50 mmHg (06/12 0624) SpO2:  [92 %-98 %] 98 % (06/12 0624) Weight change:   Intake/Output from previous day: 06/11 0701 - 06/12 0700 In: 4394.2 [P.O.:240; I.V.:3954.2; IV Piggyback:200] Out: -  Intake/Output this shift:    General appearance: alert, cooperative and no distress Resp: clear to auscultation bilaterally Cardio: regular rate and rhythm, S1, S2 normal, no murmur, click, rub or gallop GI: soft, non-tender; bowel sounds normal; no masses,  no organomegaly Extremities: extremities normal, atraumatic, no cyanosis or edema  Lab Results:  Recent Labs  10/30/13 0546 11/01/13 0640  WBC 7.2 9.6  HGB 10.9* 9.9*  HCT 34.5* 31.8*  PLT 206 221   BMET:   Recent Labs  10/31/13 1105 11/01/13 0640  NA 142 143  K 3.9 3.8  CL 104 105  CO2 25 26  GLUCOSE 98 93  BUN 27* 29*  CREATININE 1.81* 1.87*  CALCIUM 11.4* 10.8*    Recent Labs  10/30/13 0728  PTH 5.5*   Iron Studies: No results found for this basename: IRON, TIBC, TRANSFERRIN, FERRITIN,  in the last 72 hours  Studies/Results: Dg Chest Port 1 View  10/31/2013   CLINICAL DATA:  Fever  EXAM: PORTABLE CHEST - 1 VIEW  COMPARISON:  10/27/2013  FINDINGS: Cardiomediastinal silhouette is stable. No pulmonary edema. There is streaky right basilar atelectasis or infiltrate.  IMPRESSION: No pulmonary edema. Streaky right basilar atelectasis or infiltrate.   Electronically Signed   By: Lahoma Crocker M.D.   On: 10/31/2013 11:55    I have reviewed the patient's current medications.  Assessment/Plan: Problem #1 acute kidney injury superimposed on chronic. His BUN and creatinine is stable. Patient is  asymptomatic Problem #2 hypercalcemia: His calcium is still high. Presently his PTH is low but PTH related Peptide is pending His calcium is improving.  Problem #3 history of coronary artery disease Problem #4 chronic renal failure: Stage III. Problem #5 proteinuria: None nephrotic range. Patient was elevated Kappa to Lambda ratio. Since patient has lymphoma may be dealing with paraproteinemias such as light chain deposition disease. Problem #6 peripheral vascular disease Problem #7 hypertension: His blood pressure is high . Patient is on Hydralazine ,Nifedipine and Cardura Plan: We'll continue with hydration Add Labetalol 200 mg po bid We'll follow his basic metabolic panel.     LOS: 5 days   Chrisandra Wiemers S 11/01/2013,7:59 AM

## 2013-11-01 NOTE — Clinical Social Work Note (Addendum)
Discussed pt with MD who reports pt will remain in hospital through weekend. [facility] updated and remain willing to accept pt. CSW also notified [name] at [facility] who suggests resubmitting authorization Monday morning as current one will no longer be valid. Pt's wife aware of above.   Ricardo Schaefer, [place]

## 2013-11-02 LAB — BASIC METABOLIC PANEL
BUN: 31 mg/dL — ABNORMAL HIGH (ref 6–23)
CO2: 25 meq/L (ref 19–32)
CREATININE: 1.95 mg/dL — AB (ref 0.50–1.35)
Calcium: 10.6 mg/dL — ABNORMAL HIGH (ref 8.4–10.5)
Chloride: 103 mEq/L (ref 96–112)
GFR calc Af Amer: 34 mL/min — ABNORMAL LOW (ref >90)
GFR, EST NON AFRICAN AMERICAN: 30 mL/min — AB (ref >90)
GLUCOSE: 91 mg/dL (ref 70–99)
Potassium: 3.6 mEq/L — ABNORMAL LOW (ref 3.7–5.3)
SODIUM: 142 meq/L (ref 137–147)

## 2013-11-02 MED ORDER — ALBUTEROL SULFATE (2.5 MG/3ML) 0.083% IN NEBU
2.5000 mg | INHALATION_SOLUTION | RESPIRATORY_TRACT | Status: DC | PRN
  Administered 2013-11-02: 2.5 mg via RESPIRATORY_TRACT
  Filled 2013-11-02: qty 3

## 2013-11-02 MED ORDER — POTASSIUM CHLORIDE CRYS ER 20 MEQ PO TBCR
40.0000 meq | EXTENDED_RELEASE_TABLET | Freq: Once | ORAL | Status: AC
  Administered 2013-11-02: 40 meq via ORAL
  Filled 2013-11-02: qty 2

## 2013-11-02 MED ORDER — SODIUM CHLORIDE 0.9 % IV SOLN
60.0000 mg | Freq: Once | INTRAVENOUS | Status: AC
  Administered 2013-11-02: 60 mg via INTRAVENOUS
  Filled 2013-11-02: qty 20

## 2013-11-02 NOTE — Progress Notes (Signed)
[facility] PROGRESS NOTE  Jaspal Pultz PFX:[id number] DOB: 12/09/1926 DOA: 10/27/2013 PCP: [name], MD Interim summary: 80 year old male who has a past medical history of Coronary atherosclerosis of native coronary artery; Essential hypertension, benign; Non Hodgkin's lymphoma; Chronic renal insufficiency; Hyperlipidemia; Degenerative disc disease, lumbar; Duodenal ulcer; Pericardial effusion; Carotid artery disease; Stroke; Peripheral arterial disease; Enlarged prostate; Lymphoma; and Melanoma.  Today presents to the ED after patient fell yesterday morning with associated fatigue. As per patient's wife patient got up around 3 AM yesterday morning and was trying to sit on a rolling computer chair when patient fell. Patient hit his left hip and left elbow no head injury,He was unable to get up by himself. In the ED patient found to have hypercalcemia with calcium 11.8, x-ray of the left hip, left elbow were negative for fracture. Patient denies any pain at this time, able to move all extremities. CT head and chest x-ray also done which were negative. MRI on 6/8 did not show any acute changes. Renal consulted and recommendations given. On 6/11 pt developed low grade fever, was found to have mild onset of hcap. Started the patient on IV vancomycin and IV cefepime.    Assessment/Plan: 1. Failure to Thrive/Functional Decline -Initial workup unremarkable so far. He had a negative head CT, CXR did not show infiltrate, u/a was unremarkable, white count 5.6, hemaglobin 11.3, no source of infection, nonfocal exam.  -Had MRI on 10/28/2013 which did not show acute changes.  -Pending labs include PTH, SPEP, UPEP results are back.  -Continue IV fluids, intact PTH is low.  -PT consultation placed, may benefit from rehab at SNF  2. Hypercalcemia -Calcium at 10.5  Improving. On pamidronate.  -Possibly related to CKD and hyperparathyroidism -Continue IV fluids   3.  Murmur -He appeared to have a  more pronounced harsh systolic murmur on admission.  -Recent 2D Echo on 08/29/2013 showed EF of 60-65% without significant vavular disease,aortic valve sclerosis without stenosis.  -Repeat Echo show EF of 75%. With grade 1 diastolic dysfunction. Mild LVH.   4. HTN -Blood pressures remain elevated. I increased his Hydralazine to 50 mg PO TID from q daily dosing yesterday. -Will increase Nifedipine to 90 mg PO q daily and labetolol added by renal.   5.  Low Grade B-Cell Lymphoma -Medical oncology consulted, stable   6. Stage III CKD -Creatinine stable  -Will continue IV fluids and lasix as per renal recommendations.   7. ICA stenosis -Patient having carotid dopplers 10/20/2011 showing right ICA stenosis 50-69%, left ICA stenosis 70% stenosis.  -MRI on this admission did not show acute CVA. Nonfocal exam, appears to have presented with generalized weakness.  -Outpatient vascular surgery follow, has seen Dr Donnetta Hutching in the clinic.   Fever: - CXR portable showed right basilar infiltrate. Started the patient on IV vancomycin and IV cefepime.  - blood cultures are drawn and pending. Will get CXR in am to evaluate for improvement in the pneumonia.   Moderate malnutrition: Nutrition consulted and recommendations given.   DVT prophylaxis.   Code Status: DNR Family Communication: none at bedside.  Disposition Plan: PT consult, may benefit from SNF placement    HPI/Subjective: Patient is a 80 y/o with a past medical history of CAD, HTN, Non Hodgkin's Lymphoma, presenting with failure to thrive/functional decline, generalize weakness, having fall at home, admitted to the medicine service on 10/28/2013. During this hospitalization he was seen by medical oncology who felt he was stable in regards to his follicular lymphoma and  did not recommend further interventions. He seemed to have a more pronounced murmur, a 2D echo was done on 08/29/2013 showed an EF of 60 to 65%, sclerotic aortic valve without  stenosis. Repeat echo is pending.  He was given IV fluids as he was felt to be dehydrated. Patient seemed to improve with this as he was able to get up out of bed and ambulate in his room. His wife also reported that there was an improvement to his appetite.  He denies any other complaints.   Objective: Filed Vitals:   11/02/13 0430  BP: 150/54  Pulse: 81  Temp: 98.6 F (37 C)  Resp: 20    Intake/Output Summary (Last 24 hours) at 11/02/13 1333 Last data filed at 11/02/13 0600  Gross per 24 hour  Intake    600 ml  Output    950 ml  Net   -350 ml   Filed Weights   10/27/13 2130  Weight: 71.215 kg (157 lb)    Exam:   General:  Chronically ill appearing  Cardiovascular: 3/6 harsh systolic murmur, normal O9G2  Respiratory: Clear to auscultation bilaterally  Abdomen: Soft, nontender, nondistended  Musculoskeletal: Trace edema to lower extremities  Data Reviewed: Basic Metabolic Panel:  Recent Labs Lab 10/27/13 2244  10/29/13 0603 10/30/13 0546 10/30/13 0728 10/31/13 1105 11/01/13 0640 11/02/13 0502  NA 141  < > 146 144  --  142 143 142  K 4.2  < > 4.1 4.0  --  3.9 3.8 3.6*  CL 100  < > 106 105  --  104 105 103  CO2 27  < > 28 26  --  25 26 25   GLUCOSE 100*  < > 111* 109*  --  98 93 91  BUN 31*  < > 28* 25*  --  27* 29* 31*  CREATININE 2.05*  < > 1.97* 1.88*  --  1.81* 1.87* 1.95*  CALCIUM 11.8*  < > 11.6* 11.5* 11.4* 11.4* 10.8* 10.6*  PHOS 3.3  --   --   --   --   --  3.2  --   < > = values in this interval not displayed. Liver Function Tests:  Recent Labs Lab 10/27/13 2244 10/28/13 0544  AST 26 26  ALT 10 8  ALKPHOS 85 83  BILITOT 0.2* 0.2*  PROT 6.2 6.1  ALBUMIN 3.1* 3.0*   No results found for this basename: LIPASE, AMYLASE,  in the last 168 hours No results found for this basename: AMMONIA,  in the last 168 hours CBC:  Recent Labs Lab 10/27/13 2244 10/28/13 0711 10/29/13 0603 10/30/13 0546 11/01/13 0640  WBC 5.7 5.6 5.9 7.2 9.6   NEUTROABS 3.9  --   --   --   --   HGB 11.5* 11.3* 11.3* 10.9* 9.9*  HCT 35.7* 35.5* 36.3* 34.5* 31.8*  MCV 87.3 88.3 89.0 88.9 88.1  PLT 197 209 220 206 221   Cardiac Enzymes:  Recent Labs Lab 10/27/13 2244  TROPONINI <0.30   BNP (last 3 results) No results found for this basename: PROBNP,  in the last 8760 hours CBG: No results found for this basename: GLUCAP,  in the last 168 hours  Recent Results (from the past 240 hour(s))  CULTURE, BLOOD (ROUTINE X 2)     Status: None   Collection Time    10/31/13 11:05 AM      Result Value Ref Range Status   Specimen Description BLOOD RIGHT ARM   Final  Special Requests BOTTLES DRAWN AEROBIC AND ANAEROBIC 10CC BOTTLES   Final   Culture NO GROWTH 2 DAYS   Final   Report Status PENDING   Incomplete  CULTURE, BLOOD (ROUTINE X 2)     Status: None   Collection Time    10/31/13 11:14 AM      Result Value Ref Range Status   Specimen Description BLOOD RIGHT HAND   Final   Special Requests BOTTLES DRAWN AEROBIC AND ANAEROBIC 8CC BOTTLES   Final   Culture NO GROWTH 2 DAYS   Final   Report Status PENDING   Incomplete     Studies: No results found.  Scheduled Meds: . ALPRAZolam  0.5 mg Oral QID  . ceFEPime (MAXIPIME) IV  1 g Intravenous Q24H  . clopidogrel  75 mg Oral Q breakfast  . docusate sodium  100 mg Oral BID  . doxazosin  4 mg Oral Daily  . enoxaparin (LOVENOX) injection  30 mg Subcutaneous Q24H  . feeding supplement (ENSURE COMPLETE)  237 mL Oral BID BM  . furosemide  40 mg Intravenous BID  . hydrALAZINE  50 mg Oral 3 times per day  . labetalol  200 mg Oral BID  . NIFEdipine  90 mg Oral Daily  . nystatin  5 mL Oral TID AC & HS  . pamidronate  60 mg Intravenous Once  . pantoprazole  40 mg Oral Daily  . simvastatin  20 mg Oral q1800  . tamsulosin  0.4 mg Oral QHS  . vancomycin  750 mg Intravenous Q24H  . venlafaxine XR  150 mg Oral Q breakfast   Continuous Infusions: . sodium chloride 125 mL/hr at 10/31/13 1807     Principal Problem:   Weakness Active Problems:   Coronary atherosclerosis of native coronary artery   Essential hypertension, benign   Peripheral arterial disease   Chronic kidney disease, stage III (moderate)   Low-Grade, B-Cell, Follicular lymphoma   Hypercalcemia   HCAP (healthcare-associated pneumonia)   Fever, unspecified   Moderate malnutrition    Time spent: 35 min   [place] [facility] Pager [phone]. If 7PM-7AM, please contact night-coverage at [email or web address], password [facility] 11/02/2013, 1:33 PM  LOS: 6 days

## 2013-11-02 NOTE — Progress Notes (Signed)
Subjective: Interval History: complains of poor appetite but no nausea or vomiting. He has also weakness.   Objective: Vital signs in last 24 hours: Temp:  [98 F (36.7 C)-98.6 F (37 C)] 98.6 F (37 C) (06/13 0430) Pulse Rate:  [81-119] 81 (06/13 0430) Resp:  [20] 20 (06/13 0430) BP: (145-169)/(54-65) 150/54 mmHg (06/13 0430) SpO2:  [93 %-96 %] 93 % (06/13 0430) Weight change:   Intake/Output from previous day: 06/12 0701 - 06/13 0700 In: 720 [P.O.:720] Out: 1100 [Urine:1100] Intake/Output this shift:    General appearance: alert, cooperative and no distress Resp: clear to auscultation bilaterally Cardio: regular rate and rhythm, S1, S2 normal, no murmur, click, rub or gallop GI: soft, non-tender; bowel sounds normal; no masses,  no organomegaly Extremities: extremities normal, atraumatic, no cyanosis or edema  Lab Results:  Recent Labs  11/01/13 0640  WBC 9.6  HGB 9.9*  HCT 31.8*  PLT 221   BMET:   Recent Labs  11/01/13 0640 11/02/13 0502  NA 143 142  K 3.8 3.6*  CL 105 103  CO2 26 25  GLUCOSE 93 91  BUN 29* 31*  CREATININE 1.87* 1.95*  CALCIUM 10.8* 10.6*    Recent Labs  10/31/13 1114  PTH 7.0*   Iron Studies: No results found for this basename: IRON, TIBC, TRANSFERRIN, FERRITIN,  in the last 72 hours  Studies/Results: Dg Chest Port 1 View  10/31/2013   CLINICAL DATA:  Fever  EXAM: PORTABLE CHEST - 1 VIEW  COMPARISON:  10/27/2013  FINDINGS: Cardiomediastinal silhouette is stable. No pulmonary edema. There is streaky right basilar atelectasis or infiltrate.  IMPRESSION: No pulmonary edema. Streaky right basilar atelectasis or infiltrate.   Electronically Signed   By: Lahoma Crocker M.D.   On: 10/31/2013 11:55    I have reviewed the patient's current medications.  Assessment/Plan: Problem #1 acute kidney injury superimposed on chronic. His BUN and creatinine is increasing.  Problem #2 hypercalcemia: His calcium is still high. Presently his PTH is  low and slightly elevated 1,25 vitamin D . Pth related peptied is pending. His calcium is still high and most likely it is due to his Lyphoma Problem #3 history of coronary artery disease Problem #4 chronic renal failure: Stage III. Problem #5 proteinuria: None nephrotic range. Patient was elevated Kappa to Lambda ratio. Since patient has lymphoma may be dealing with paraproteinemias such as light chain deposition disease. Problem #6 peripheral vascular disease Problem #7 hypertension: His blood pressure is better controlled to day after addition of Labetalol Plan: We'll continue with hydration Because of his persistant hypercalcemia We will give Pamidronate 60 iv one dose We'll follow his basic metabolic panel.     LOS: 6 days   Petrina Melby S 11/02/2013,8:17 AM

## 2013-11-03 ENCOUNTER — Inpatient Hospital Stay (HOSPITAL_COMMUNITY): Payer: Medicare Other

## 2013-11-03 LAB — VANCOMYCIN, TROUGH: Vancomycin Tr: 16.4 ug/mL (ref 10.0–20.0)

## 2013-11-03 LAB — BASIC METABOLIC PANEL
BUN: 34 mg/dL — AB (ref 6–23)
CALCIUM: 10.7 mg/dL — AB (ref 8.4–10.5)
CO2: 25 mEq/L (ref 19–32)
CREATININE: 2 mg/dL — AB (ref 0.50–1.35)
Chloride: 102 mEq/L (ref 96–112)
GFR, EST AFRICAN AMERICAN: 33 mL/min — AB (ref >90)
GFR, EST NON AFRICAN AMERICAN: 29 mL/min — AB (ref >90)
GLUCOSE: 97 mg/dL (ref 70–99)
Potassium: 3.6 mEq/L — ABNORMAL LOW (ref 3.7–5.3)
Sodium: 141 mEq/L (ref 137–147)

## 2013-11-03 MED ORDER — FLEET ENEMA 7-19 GM/118ML RE ENEM
1.0000 | ENEMA | Freq: Once | RECTAL | Status: AC
  Administered 2013-11-04: 1 via RECTAL

## 2013-11-03 NOTE — Progress Notes (Signed)
Subjective: Interval History: he offers no complaint. No nausea or difficulty in breathing   Objective: Vital signs in last 24 hours: Temp:  [97.6 F (36.4 C)-97.8 F (36.6 C)] 97.8 F (36.6 C) (06/14 0416) Pulse Rate:  [71-84] 82 (06/14 0416) Resp:  [20-25] 20 (06/14 0416) BP: (142-152)/(41-76) 152/44 mmHg (06/14 0416) SpO2:  [98 %-100 %] 98 % (06/14 0416) Weight:  [79 kg (174 lb 2.6 oz)] 79 kg (174 lb 2.6 oz) (06/14 0416) Weight change:   Intake/Output from previous day: 06/13 0701 - 06/14 0700 In: 600 [P.O.:600] Out: 600 [Urine:600] Intake/Output this shift:    General appearance: alert, cooperative and no distress Resp: clear to auscultation bilaterally Cardio: regular rate and rhythm, S1, S2 normal, no murmur, click, rub or gallop GI: soft, non-tender; bowel sounds normal; no masses,  no organomegaly Extremities: extremities normal, atraumatic, no cyanosis or edema  Lab Results:  Recent Labs  11/01/13 0640  WBC 9.6  HGB 9.9*  HCT 31.8*  PLT 221   BMET:   Recent Labs  11/02/13 0502 11/03/13 0536  NA 142 141  K 3.6* 3.6*  CL 103 102  CO2 25 25  GLUCOSE 91 97  BUN 31* 34*  CREATININE 1.95* 2.00*  CALCIUM 10.6* 10.7*    Recent Labs  10/31/13 1114  PTH 7.0*   Iron Studies: No results found for this basename: IRON, TIBC, TRANSFERRIN, FERRITIN,  in the last 72 hours  Studies/Results: No results found.  I have reviewed the patient's current medications.  Assessment/Plan: Problem #1 acute kidney injury superimposed on chronic. His BUN and creatinine is still increasing very slowly. Patient none oiguric . He is asymptomatic Problem #2 hypercalcemia: His calcium is still high but stable Problem #3 history of coronary artery disease Problem #4 chronic renal failure: Stage III. Problem #5 proteinuria: None nephrotic range. Problem #6 peripheral vascular disease Problem #7 hypertension: His blood pressure is seems reasonably controlled Problem# 8  anemai Plan: We'll increase his ivf to 125 cc/hr Continue with lasix Iron studies in am We'll follow his basic metabolic panel.     LOS: 7 days   Ricardo Schaefer S 11/03/2013,8:05 AM

## 2013-11-03 NOTE — Progress Notes (Signed)
[facility] PROGRESS NOTE  Ricardo Schaefer TDD:[id number] DOB: November 01, 1926 DOA: 10/27/2013 PCP: [name], MD Interim summary: 80 year old male who has a past medical history of Coronary atherosclerosis of native coronary artery; Essential hypertension, benign; Non Hodgkin's lymphoma; Chronic renal insufficiency; Hyperlipidemia; Degenerative disc disease, lumbar; Duodenal ulcer; Pericardial effusion; Carotid artery disease; Stroke; Peripheral arterial disease; Enlarged prostate; Lymphoma; and Melanoma.  Today presents to the ED after patient fell yesterday morning with associated fatigue. As per patient's wife patient got up around 3 AM yesterday morning and was trying to sit on a rolling computer chair when patient fell. Patient hit his left hip and left elbow no head injury,He was unable to get up by himself. In the ED patient found to have hypercalcemia with calcium 11.8, x-ray of the left hip, left elbow were negative for fracture. Patient denies any pain at this time, able to move all extremities. CT head and chest x-ray also done which were negative. MRI on 6/8 did not show any acute changes. Renal consulted and recommendations given. On 6/11 pt developed low grade fever, was found to have mild onset of hcap. Started the patient on IV vancomycin and IV cefepime. Repeat CXR shows improving pneumonia.    Assessment/Plan: 1. Failure to Thrive/Functional Decline -Initial workup unremarkable so far. He had a negative head CT, CXR did not show infiltrate, u/a was unremarkable, white count 5.6, hemaglobin 11.3, no source of infection, nonfocal exam.  -Had MRI on 10/28/2013 which did not show acute changes.  -Pending labs include PTH, SPEP, UPEP results are back.  -Continue IV fluids, intact PTH is low.  -PT consultation placed, may benefit from rehab at SNF  2. Hypercalcemia -Calcium at 10.7  Improving. On pamidronate one dose given on 6/13.  -Possibly related to CKD and  hyperparathyroidism -Continue IV fluids   3.  Murmur -He appeared to have a more pronounced harsh systolic murmur on admission.  -Recent 2D Echo on 08/29/2013 showed EF of 60-65% without significant vavular disease,aortic valve sclerosis without stenosis.  -Repeat Echo show EF of 75%. With grade 1 diastolic dysfunction. Mild LVH.   4. HTN -Blood pressures remain elevated. I increased his Hydralazine to 50 mg PO TID from q daily dosing yesterday. -Will increase Nifedipine to 90 mg PO q daily and labetolol added by renal.   5.  Low Grade B-Cell Lymphoma -Medical oncology consulted, stable   6. Stage III CKD -Creatinine stable  -Will continue IV fluids and lasix as per renal recommendations.   7. ICA stenosis -Patient having carotid dopplers 10/20/2011 showing right ICA stenosis 50-69%, left ICA stenosis 70% stenosis.  -MRI on this admission did not show acute CVA. Nonfocal exam, appears to have presented with generalized weakness.  -Outpatient vascular surgery follow, has seen Dr Donnetta Hutching in the clinic.   Fever: - CXR portable showed right basilar infiltrate. Started the patient on IV vancomycin and IV cefepime.  - blood cultures are drawn and pending.  repat CXR in am  Shows improvement in the pneumonia. Will transition to oral antibiotics in am.   Moderate malnutrition: Nutrition consulted and recommendations given.   DVT prophylaxis.   Code Status: DNR Family Communication: none at bedside.  Disposition Plan: PT consult, may benefit from SNF placement    HPI/Subjective: Patient is a 80 y/o with a past medical history of CAD, HTN, Non Hodgkin's Lymphoma, presenting with failure to thrive/functional decline, generalize weakness, having fall at home, admitted to the medicine service on 10/28/2013. During this hospitalization he was  seen by medical oncology who felt he was stable in regards to his follicular lymphoma and did not recommend further interventions. He seemed to have a more  pronounced murmur, a 2D echo was done on 08/29/2013 showed an EF of 60 to 65%, sclerotic aortic valve without stenosis. Repeat echo is pending.  He was given IV fluids as he was felt to be dehydrated. Patient seemed to improve with this as he was able to get up out of bed and ambulate in his room. His wife also reported that there was an improvement to his appetite.  He denies any other complaints.   Objective: Filed Vitals:   11/03/13 1456  BP: 149/63  Pulse: 82  Temp: 97.9 F (36.6 C)  Resp: 20    Intake/Output Summary (Last 24 hours) at 11/03/13 1849 Last data filed at 11/03/13 0914  Gross per 24 hour  Intake    480 ml  Output    950 ml  Net   -470 ml   Filed Weights   10/27/13 2130 11/03/13 0416  Weight: 71.215 kg (157 lb) 79 kg (174 lb 2.6 oz)    Exam:   General:  Chronically ill appearing  Cardiovascular: 3/6 harsh systolic murmur, normal W1X9  Respiratory: Clear to auscultation bilaterally  Abdomen: Soft, nontender, nondistended  Musculoskeletal: Trace edema to lower extremities  Data Reviewed: Basic Metabolic Panel:  Recent Labs Lab 10/27/13 2244  10/30/13 0546 10/30/13 0728 10/31/13 1105 11/01/13 0640 11/02/13 0502 11/03/13 0536  NA 141  < > 144  --  142 143 142 141  K 4.2  < > 4.0  --  3.9 3.8 3.6* 3.6*  CL 100  < > 105  --  104 105 103 102  CO2 27  < > 26  --  25 26 25 25   GLUCOSE 100*  < > 109*  --  98 93 91 97  BUN 31*  < > 25*  --  27* 29* 31* 34*  CREATININE 2.05*  < > 1.88*  --  1.81* 1.87* 1.95* 2.00*  CALCIUM 11.8*  < > 11.5* 11.4* 11.4* 10.8* 10.6* 10.7*  PHOS 3.3  --   --   --   --  3.2  --   --   < > = values in this interval not displayed. Liver Function Tests:  Recent Labs Lab 10/27/13 2244 10/28/13 0544  AST 26 26  ALT 10 8  ALKPHOS 85 83  BILITOT 0.2* 0.2*  PROT 6.2 6.1  ALBUMIN 3.1* 3.0*   No results found for this basename: LIPASE, AMYLASE,  in the last 168 hours No results found for this basename: AMMONIA,  in the  last 168 hours CBC:  Recent Labs Lab 10/27/13 2244 10/28/13 0711 10/29/13 0603 10/30/13 0546 11/01/13 0640  WBC 5.7 5.6 5.9 7.2 9.6  NEUTROABS 3.9  --   --   --   --   HGB 11.5* 11.3* 11.3* 10.9* 9.9*  HCT 35.7* 35.5* 36.3* 34.5* 31.8*  MCV 87.3 88.3 89.0 88.9 88.1  PLT 197 209 220 206 221   Cardiac Enzymes:  Recent Labs Lab 10/27/13 2244  TROPONINI <0.30   BNP (last 3 results) No results found for this basename: PROBNP,  in the last 8760 hours CBG: No results found for this basename: GLUCAP,  in the last 168 hours  Recent Results (from the past 240 hour(s))  CULTURE, BLOOD (ROUTINE X 2)     Status: None   Collection Time  10/31/13 11:05 AM      Result Value Ref Range Status   Specimen Description BLOOD RIGHT ARM   Final   Special Requests BOTTLES DRAWN AEROBIC AND ANAEROBIC 10CC BOTTLES   Final   Culture NO GROWTH 3 DAYS   Final   Report Status PENDING   Incomplete  CULTURE, BLOOD (ROUTINE X 2)     Status: None   Collection Time    10/31/13 11:14 AM      Result Value Ref Range Status   Specimen Description BLOOD RIGHT HAND   Final   Special Requests BOTTLES DRAWN AEROBIC AND ANAEROBIC 8CC BOTTLES   Final   Culture NO GROWTH 3 DAYS   Final   Report Status PENDING   Incomplete     Studies: Dg Chest 2 View  11/03/2013   CLINICAL DATA:  Shortness of breath.  EXAM: CHEST  2 VIEW  COMPARISON:  10/31/2013 .  FINDINGS: Mediastinum and hilar structures normal. Cardiomegaly with normal pulmonary vascularity. Interim clearing of mild basilar atelectasis. No focal infiltrate. Persistent right pleural effusion best seen on lateral view. No pneumothorax. Mild right base pleural parenchymal thickening noted consistent with scarring.  IMPRESSION: 1. Interim clearing of right base atelectasis. Persistent right pleural effusion, best seen on lateral view.  2.  Stable cardiomegaly .   Electronically Signed   By: [place]   On: 11/03/2013 10:45    Scheduled Meds: .  ALPRAZolam  0.5 mg Oral QID  . ceFEPime (MAXIPIME) IV  1 g Intravenous Q24H  . clopidogrel  75 mg Oral Q breakfast  . docusate sodium  100 mg Oral BID  . doxazosin  4 mg Oral Daily  . enoxaparin (LOVENOX) injection  30 mg Subcutaneous Q24H  . feeding supplement (ENSURE COMPLETE)  237 mL Oral BID BM  . furosemide  40 mg Intravenous BID  . hydrALAZINE  50 mg Oral 3 times per day  . labetalol  200 mg Oral BID  . NIFEdipine  90 mg Oral Daily  . nystatin  5 mL Oral TID AC & HS  . pantoprazole  40 mg Oral Daily  . simvastatin  20 mg Oral q1800  . tamsulosin  0.4 mg Oral QHS  . vancomycin  750 mg Intravenous Q24H  . venlafaxine XR  150 mg Oral Q breakfast   Continuous Infusions: . sodium chloride 125 mL/hr at 10/31/13 1807    Principal Problem:   Weakness Active Problems:   Coronary atherosclerosis of native coronary artery   Essential hypertension, benign   Peripheral arterial disease   Chronic kidney disease, stage III (moderate)   Low-Grade, B-Cell, Follicular lymphoma   Hypercalcemia   HCAP (healthcare-associated pneumonia)   Fever, unspecified   Moderate malnutrition    Time spent: 35 min   [place] [facility] Pager [phone]. If 7PM-7AM, please contact night-coverage at [email or web address], password [facility] 11/03/2013, 6:49 PM  LOS: 7 days

## 2013-11-03 NOTE — Progress Notes (Signed)
Earlier in night, approximately 2030, patient had audible wheezing upon entering room.  Patient oxygen saturation in upper 90's vitals stable.  Patient stated that he had trouble breathing for the last two days.  Notified mid-level.  Received orders and carried out.  Albuterol treatment given, patient stated he felt better.  Will continue to monitor patient.

## 2013-11-03 NOTE — Progress Notes (Deleted)
Patient had audible wheezes upon entering room.  Patient oxygen saturation in upper 90s.  Patient had c/o of not being able to breathe for the past two days.  Notified Mid-level on call.  Orders received and carried out.  Will continue to monitor patient.

## 2013-11-03 NOTE — Progress Notes (Signed)
ANTIBIOTIC CONSULT NOTE -  Pharmacy Consult for Vancomycin and Cefepime  Indication: pneumonia  Allergies  Allergen Reactions  . Aspirin     Causes stomach bleeds    Patient Measurements: Height: 5\' 8"  (172.7 cm) Weight: 174 lb 2.6 oz (79 kg) IBW/kg (Calculated) : 68.4   Vital Signs: Temp: 98.5 F (36.9 C) (06/14 2038) Temp src: Oral (06/14 2038) BP: 177/55 mmHg (06/14 2038) Pulse Rate: 52 (06/14 2038) Intake/Output from previous day: 06/13 0701 - 06/14 0700 In: 600 [P.O.:600] Out: 600 [Urine:600] Intake/Output from this shift:    Labs:  Recent Labs  11/01/13 0640 11/02/13 0502 11/03/13 0536  WBC 9.6  --   --   HGB 9.9*  --   --   PLT 221  --   --   CREATININE 1.87* 1.95* 2.00*   Estimated Creatinine Clearance: 26.1 ml/min (by C-G formula based on Cr of 2).  Recent Labs  11/03/13 1900  VANCOTROUGH 16.4     Microbiology: Recent Results (from the past 720 hour(s))  CULTURE, BLOOD (ROUTINE X 2)     Status: None   Collection Time    10/31/13 11:05 AM      Result Value Ref Range Status   Specimen Description BLOOD RIGHT ARM   Final   Special Requests BOTTLES DRAWN AEROBIC AND ANAEROBIC 10CC BOTTLES   Final   Culture NO GROWTH 3 DAYS   Final   Report Status PENDING   Incomplete  CULTURE, BLOOD (ROUTINE X 2)     Status: None   Collection Time    10/31/13 11:14 AM      Result Value Ref Range Status   Specimen Description BLOOD RIGHT HAND   Final   Special Requests BOTTLES DRAWN AEROBIC AND ANAEROBIC 8CC BOTTLES   Final   Culture NO GROWTH 3 DAYS   Final   Report Status PENDING   Incomplete    Medical History: Past Medical History  Diagnosis Date  . Coronary atherosclerosis of native coronary artery     Occluded RCA with collaterals, DES to LAD and circumflex 2005  . Essential hypertension, benign   . Non Hodgkin's lymphoma   . Chronic renal insufficiency   . Hyperlipidemia   . Degenerative disc disease, lumbar   . Duodenal ulcer     EGD 6/11 -  iron deficiency anemia  . Pericardial effusion     Status post window  . Carotid artery disease     Nonobstructive, less than 37% LICA by angiography 3/42  . Stroke     Occurred 1999, had left atrial appendage thrombus at that time - previously on Coumadin  . Peripheral arterial disease   . Enlarged prostate   . Lymphoma   . Melanoma     Medications:  Scheduled:  . ALPRAZolam  0.5 mg Oral QID  . ceFEPime (MAXIPIME) IV  1 g Intravenous Q24H  . clopidogrel  75 mg Oral Q breakfast  . docusate sodium  100 mg Oral BID  . doxazosin  4 mg Oral Daily  . enoxaparin (LOVENOX) injection  30 mg Subcutaneous Q24H  . feeding supplement (ENSURE COMPLETE)  237 mL Oral BID BM  . furosemide  40 mg Intravenous BID  . hydrALAZINE  50 mg Oral 3 times per day  . labetalol  200 mg Oral BID  . NIFEdipine  90 mg Oral Daily  . nystatin  5 mL Oral TID AC & HS  . pantoprazole  40 mg Oral Daily  . simvastatin  20 mg Oral q1800  . tamsulosin  0.4 mg Oral QHS  . vancomycin  750 mg Intravenous Q24H  . venlafaxine XR  150 mg Oral Q breakfast   Assessment: Okay for Protocol, Fever:  CXR portable showed right basilar infiltrate to be started on IV vancomycin and IV cefepime. 80 y/o with a past medical history of CAD, HTN, Non Hodgkin's Lymphoma, presenting with failure to thrive/functional decline, generalize weakness, having fall at home, admitted to the medicine service on 10/28/2013. Vancomycin trough at goal  Vancomycin 6/11 >> Cefepime 6/11 >>  Goal of Therapy:  Vancomycin trough level 15-20 mcg/ml  Plan:  Continue Vancomycin 750mg  IV every 24 hours. Continue Cefepime 1gm IV every 24 hours. Vancomycin trough weekly unless SCR indicates earlier level Labs per protocol  Ricardo Schaefer, Ricardo Schaefer 11/03/2013,9:18 PM

## 2013-11-04 LAB — BASIC METABOLIC PANEL
BUN: 33 mg/dL — ABNORMAL HIGH (ref 6–23)
CHLORIDE: 105 meq/L (ref 96–112)
CO2: 28 meq/L (ref 19–32)
CREATININE: 2.07 mg/dL — AB (ref 0.50–1.35)
Calcium: 10.6 mg/dL — ABNORMAL HIGH (ref 8.4–10.5)
GFR calc Af Amer: 32 mL/min — ABNORMAL LOW (ref >90)
GFR calc non Af Amer: 28 mL/min — ABNORMAL LOW (ref >90)
Glucose, Bld: 100 mg/dL — ABNORMAL HIGH (ref 70–99)
Potassium: 3.6 mEq/L — ABNORMAL LOW (ref 3.7–5.3)
SODIUM: 145 meq/L (ref 137–147)

## 2013-11-04 LAB — CBC
HEMATOCRIT: 31 % — AB (ref 39.0–52.0)
Hemoglobin: 9.7 g/dL — ABNORMAL LOW (ref 13.0–17.0)
MCH: 27.6 pg (ref 26.0–34.0)
MCHC: 31.3 g/dL (ref 30.0–36.0)
MCV: 88.1 fL (ref 78.0–100.0)
Platelets: 237 10*3/uL (ref 150–400)
RBC: 3.52 MIL/uL — AB (ref 4.22–5.81)
RDW: 14.4 % (ref 11.5–15.5)
WBC: 5.8 10*3/uL (ref 4.0–10.5)

## 2013-11-04 LAB — IRON AND TIBC
IRON: 14 ug/dL — AB (ref 42–135)
Saturation Ratios: 6 % — ABNORMAL LOW (ref 20–55)
TIBC: 241 ug/dL (ref 215–435)
UIBC: 227 ug/dL (ref 125–400)

## 2013-11-04 LAB — FERRITIN: FERRITIN: 110 ng/mL (ref 22–322)

## 2013-11-04 MED ORDER — POTASSIUM CHLORIDE 20 MEQ/15ML (10%) PO LIQD
20.0000 meq | Freq: Once | ORAL | Status: AC
  Administered 2013-11-04: 20 meq via ORAL
  Filled 2013-11-04: qty 30

## 2013-11-04 MED ORDER — LEVOFLOXACIN 500 MG PO TABS
500.0000 mg | ORAL_TABLET | Freq: Every day | ORAL | Status: DC
  Administered 2013-11-05: 500 mg via ORAL
  Filled 2013-11-04: qty 1

## 2013-11-04 NOTE — Care Management Utilization Note (Signed)
UR completed 

## 2013-11-04 NOTE — Clinical Social Work Note (Signed)
Pt not ready for d/c to [facility] today per MD. CSW initiated [facility] authorization again and updated PNC. Facility continues to be willing to accept pt and received insurance authorization today.  Benay Pike, [place]

## 2013-11-04 NOTE — Progress Notes (Signed)
[facility] PROGRESS NOTE  Cleophus Mendonsa DGL:[id number] DOB: May 26, 1926 DOA: 10/27/2013 PCP: [name], MD Interim summary: 80 year old male who has a past medical history of Coronary atherosclerosis of native coronary artery; Essential hypertension, benign; Non Hodgkin's lymphoma; Chronic renal insufficiency; Hyperlipidemia; Degenerative disc disease, lumbar; Duodenal ulcer; Pericardial effusion; Carotid artery disease; Stroke; Peripheral arterial disease; Enlarged prostate; Lymphoma; and Melanoma.  Today presents to the ED after patient fell yesterday morning with associated fatigue. As per patient's wife patient got up around 3 AM yesterday morning and was trying to sit on a rolling computer chair when patient fell. Patient hit his left hip and left elbow no head injury,He was unable to get up by himself. In the ED patient found to have hypercalcemia with calcium 11.8, x-ray of the left hip, left elbow were negative for fracture. Patient denies any pain at this time, able to move all extremities. CT head and chest x-ray also done which were negative. MRI on 6/8 did not show any acute changes. Renal consulted and recommendations given. On 6/11 pt developed low grade fever, was found to have mild onset of hcap. Started the patient on IV vancomycin and IV cefepime. Repeat CXR shows improving pneumonia. Will transition to oral levaquin.    Assessment/Plan: 1. Failure to Thrive/Functional Decline -Initial workup unremarkable so far. He had a negative head CT, CXR did not show infiltrate, u/a was unremarkable, white count 5.6, hemaglobin 11.3, no source of infection, nonfocal exam.  -Had MRI on 10/28/2013 which did not show acute changes.  - PTH, SPEP, UPEP results are back.  -Continue IV fluids, intact PTH is low.  -PT consultation placed, may benefit from rehab at SNF  2. Hypercalcemia -Calcium at 10.7  Improving. On pamidronate one dose given on 6/13.  -Possibly related to CKD -Continue  IV fluids and lasix. Further management as per renal.    3.  Murmur -He appeared to have a more pronounced harsh systolic murmur on admission.  -Recent 2D Echo on 08/29/2013 showed EF of 60-65% without significant vavular disease,aortic valve sclerosis without stenosis.  -Repeat Echo show EF of 75%. With grade 1 diastolic dysfunction. Mild LVH.   4. HTN -Blood pressures remain elevated, but much better controlled.I so far we have increased his Hydralazine to 50 mg PO TID from q daily. -Will increase Nifedipine to 90 mg PO q daily and labetolol added by renal.   5.  Low Grade B-Cell Lymphoma -Medical oncology consulted, stable   6. Stage III CKD -Creatinine stable  -Will continue IV fluids and lasix as per renal recommendations.   7. ICA stenosis -Patient having carotid dopplers 10/20/2011 showing right ICA stenosis 50-69%, left ICA stenosis 70% stenosis.  -MRI on this admission did not show acute CVA. Nonfocal exam, appears to have presented with generalized weakness.  -Outpatient vascular surgery follow, has seen Dr Donnetta Hutching in the clinic.   Fever: - CXR portable showed right basilar infiltrate. Started the patient on IV vancomycin and IV cefepime.  - blood cultures are drawn and pending.  repat CXR in am  Shows improvement in the pneumonia. Will transition to oral antibiotics in am.   Moderate malnutrition: Nutrition consulted and recommendations given.   DVT prophylaxis.   Code Status: DNR Family Communication: none at bedside.  Disposition Plan: PT consult, may benefit from SNF placement    HPI/Subjective: Patient is a 80 y/o with a past medical history of CAD, HTN, Non Hodgkin's Lymphoma, presenting with failure to thrive/functional decline, generalize weakness, having fall  at home, admitted to the medicine service on 10/28/2013. During this hospitalization he was seen by medical oncology who felt he was stable in regards to his follicular lymphoma and did not recommend further  interventions. He seemed to have a more pronounced murmur, a 2D echo was done on 08/29/2013 showed an EF of 60 to 65%, sclerotic aortic valve without stenosis. Repeat echo is pending.  He was given IV fluids as he was felt to be dehydrated. Patient seemed to improve with this as he was able to get up out of bed and ambulate in his room. His wife also reported that there was an improvement to his appetite.  He denies any other complaints.   Objective: Filed Vitals:   11/04/13 0446  BP: 145/39  Pulse: 73  Temp: 98.3 F (36.8 C)  Resp: 20    Intake/Output Summary (Last 24 hours) at 11/04/13 0953 Last data filed at 11/04/13 0740  Gross per 24 hour  Intake    240 ml  Output    801 ml  Net   -561 ml   Filed Weights   10/27/13 2130 11/03/13 0416 11/04/13 0446  Weight: 71.215 kg (157 lb) 79 kg (174 lb 2.6 oz) 77.5 kg (170 lb 13.7 oz)    Exam:   General:  Chronically ill appearing  Cardiovascular: 3/6 harsh systolic murmur, normal N2D7  Respiratory: Clear to auscultation bilaterally  Abdomen: Soft, nontender, nondistended  Musculoskeletal: Trace edema to lower extremities  Data Reviewed: Basic Metabolic Panel:  Recent Labs Lab 10/31/13 1105 11/01/13 0640 11/02/13 0502 11/03/13 0536 11/04/13 0429  NA 142 143 142 141 145  K 3.9 3.8 3.6* 3.6* 3.6*  CL 104 105 103 102 105  CO2 25 26 25 25 28   GLUCOSE 98 93 91 97 100*  BUN 27* 29* 31* 34* 33*  CREATININE 1.81* 1.87* 1.95* 2.00* 2.07*  CALCIUM 11.4* 10.8* 10.6* 10.7* 10.6*  PHOS  --  3.2  --   --   --    Liver Function Tests: No results found for this basename: AST, ALT, ALKPHOS, BILITOT, PROT, ALBUMIN,  in the last 168 hours No results found for this basename: LIPASE, AMYLASE,  in the last 168 hours No results found for this basename: AMMONIA,  in the last 168 hours CBC:  Recent Labs Lab 10/29/13 0603 10/30/13 0546 11/01/13 0640 11/04/13 0429  WBC 5.9 7.2 9.6 5.8  HGB 11.3* 10.9* 9.9* 9.7*  HCT 36.3* 34.5*  31.8* 31.0*  MCV 89.0 88.9 88.1 88.1  PLT 220 206 221 237   Cardiac Enzymes: No results found for this basename: CKTOTAL, CKMB, CKMBINDEX, TROPONINI,  in the last 168 hours BNP (last 3 results) No results found for this basename: PROBNP,  in the last 8760 hours CBG: No results found for this basename: GLUCAP,  in the last 168 hours  Recent Results (from the past 240 hour(s))  CULTURE, BLOOD (ROUTINE X 2)     Status: None   Collection Time    10/31/13 11:05 AM      Result Value Ref Range Status   Specimen Description BLOOD RIGHT ARM   Final   Special Requests BOTTLES DRAWN AEROBIC AND ANAEROBIC 10CC BOTTLES   Final   Culture NO GROWTH 3 DAYS   Final   Report Status PENDING   Incomplete  CULTURE, BLOOD (ROUTINE X 2)     Status: None   Collection Time    10/31/13 11:14 AM      Result Value Ref  Range Status   Specimen Description BLOOD RIGHT HAND   Final   Special Requests BOTTLES DRAWN AEROBIC AND ANAEROBIC 8CC BOTTLES   Final   Culture NO GROWTH 3 DAYS   Final   Report Status PENDING   Incomplete     Studies: Dg Chest 2 View  11/03/2013   CLINICAL DATA:  Shortness of breath.  EXAM: CHEST  2 VIEW  COMPARISON:  10/31/2013 .  FINDINGS: Mediastinum and hilar structures normal. Cardiomegaly with normal pulmonary vascularity. Interim clearing of mild basilar atelectasis. No focal infiltrate. Persistent right pleural effusion best seen on lateral view. No pneumothorax. Mild right base pleural parenchymal thickening noted consistent with scarring.  IMPRESSION: 1. Interim clearing of right base atelectasis. Persistent right pleural effusion, best seen on lateral view.  2.  Stable cardiomegaly .   Electronically Signed   By: Avery   On: 11/03/2013 10:45    Scheduled Meds: . ALPRAZolam  0.5 mg Oral QID  . ceFEPime (MAXIPIME) IV  1 g Intravenous Q24H  . clopidogrel  75 mg Oral Q breakfast  . docusate sodium  100 mg Oral BID  . doxazosin  4 mg Oral Daily  . enoxaparin (LOVENOX)  injection  30 mg Subcutaneous Q24H  . feeding supplement (ENSURE COMPLETE)  237 mL Oral BID BM  . furosemide  40 mg Intravenous BID  . hydrALAZINE  50 mg Oral 3 times per day  . labetalol  200 mg Oral BID  . NIFEdipine  90 mg Oral Daily  . nystatin  5 mL Oral TID AC & HS  . pantoprazole  40 mg Oral Daily  . potassium chloride  20 mEq Oral Once  . simvastatin  20 mg Oral q1800  . tamsulosin  0.4 mg Oral QHS  . vancomycin  750 mg Intravenous Q24H  . venlafaxine XR  150 mg Oral Q breakfast   Continuous Infusions: . sodium chloride 125 mL/hr at 10/31/13 1807    Principal Problem:   Weakness Active Problems:   Coronary atherosclerosis of native coronary artery   Essential hypertension, benign   Peripheral arterial disease   Chronic kidney disease, stage III (moderate)   Low-Grade, B-Cell, Follicular lymphoma   Hypercalcemia   HCAP (healthcare-associated pneumonia)   Fever, unspecified   Moderate malnutrition    Time spent: 25 min   [facility] Pager [phone]. If 7PM-7AM, please contact night-coverage at [email or web address], password [facility] [place] 11/04/2013, 9:53 AM  LOS: 8 days

## 2013-11-04 NOTE — Progress Notes (Signed)
Subjective: Interval History: Patient any difficulty in breathing. He denies any nausea or vomiting. Patient states that he has moved his bowels last night after 3 days of constipation.   Objective: Vgns in la 6 she st 24 hours: Temp:  [97.9 F (36.6 C)-98.5 F (36.9 C)] 98.3 F (36.8 C) (06/15 0446) Pulse Rate:  [52-98] 73 (06/15 0446) Resp:  [20] 20 (06/15 0446) BP: (145-177)/(39-63) 145/39 mmHg (06/15 0446) SpO2:  [97 %-99 %] 97 % (06/15 0446) Weight:  [77.5 kg (170 lb 13.7 oz)] 77.5 kg (170 lb 13.7 oz) (06/15 0446) Weight change: -1.5 kg (-3 lb 4.9 oz)  Intake/Output from previous day: 06/14 0701 - 06/15 0700 In: 480 [P.O.:480] Out: 951 [Urine:950; Stool:1] Intake/Output this shift:    Generally patient is alert and in no apparent distress. Chest decreased breath sound otherwise doesn't have any wheezing rales or rhonchi. Heart exam regular rate and rhythm Abdomen seems to be full and distended. Nontender. Extremities he has trace edema bilaterally.   Lab Results:  Recent Labs  11/04/13 0429  WBC 5.8  HGB 9.7*  HCT 31.0*  PLT 237   BMET:   Recent Labs  11/03/13 0536 11/04/13 0429  NA 141 145  K 3.6* 3.6*  CL 102 105  CO2 25 28  GLUCOSE 97 100*  BUN 34* 33*  CREATININE 2.00* 2.07*  CALCIUM 10.7* 10.6*   No results found for this basename: PTH,  in the last 72 hours Iron Studies: No results found for this basename: IRON, TIBC, TRANSFERRIN, FERRITIN,  in the last 72 hours  Studies/Results: Dg Chest 2 View  11/03/2013   CLINICAL DATA:  Shortness of breath.  EXAM: CHEST  2 VIEW  COMPARISON:  10/31/2013 .  FINDINGS: Mediastinum and hilar structures normal. Cardiomegaly with normal pulmonary vascularity. Interim clearing of mild basilar atelectasis. No focal infiltrate. Persistent right pleural effusion best seen on lateral view. No pneumothorax. Mild right base pleural parenchymal thickening noted consistent with scarring.  IMPRESSION: 1. Interim clearing of  right base atelectasis. Persistent right pleural effusion, best seen on lateral view.  2.  Stable cardiomegaly .   Electronically Signed   By: Marcello Moores  Register   On: 11/03/2013 10:45    I have reviewed the patient's current medications.  Assessment/Plan: Problem #1 acute kidney injury superimposed on chronic. His BUN and creatinine is still increasing very slowly. patient is none oliguric and presently asymptomatic. His potassium is normal.  Problem #2 hypercalcemia: patient is status post pamidronate and presently is also on IV fluid. His calcium still remains high.  Problem #3 history of coronary artery disease Problem #4 chronic renal failure: Stage III. Problem #5 proteinuria: None nephrotic range. Problem #6 peripheral vascular disease Problem #7 hypertension: His blood pressure is seems reasonably controlled Problem# 8 anemia. His iron studies are pending.  Plan: We'll continue his hydration. Continue with lasix We'll follow his basic metabolic panel.     LOS: 8 days   Dakoda Bassette S 11/04/2013,7:34 AM

## 2013-11-04 NOTE — Progress Notes (Signed)
Physical Therapy Treatment Patient Details Name: Ricardo Schaefer MRN: [id number] DOB: January 12, 1927 Today's Date: 11/04/2013    History of Present Illness 80 year old male who has a past medical history of Coronary atherosclerosis of native coronary artery; Essential hypertension, benign; Non Hodgkin's lymphoma; Chronic renal insufficiency; Hyperlipidemia; Degenerative disc disease, lumbar; Duodenal ulcer; Pericardial effusion; Carotid artery disease; Stroke; Peripheral arterial disease; Enlarged prostate; Lymphoma; and Melanoma.    PT Comments    Patient compliant with all exercises today, and demonstrated active participation in supine exercises and transfers.  Patient demonstrated decreased assistance with sit <-> stand transfer with only min assist x2 trials, and mod assist x2 trials.  Patient extremely quick to fatigue with functional activities.  No resistance to OOB activities, or sitting in bedside chair after therapy.  NSG aide made aware of patient left in chair, with call bell/phone in reach and alarm set.   Follow Up Recommendations  SNF     Equipment Recommendations  Rolling walker with 5" wheels           Mobility  Bed Mobility Overal bed mobility: Needs Assistance Bed Mobility: Supine to Sit     Supine to sit: HOB elevated;Supervision (VC for technique, though no physical assist required )        Transfers Overall transfer level: Needs assistance Equipment used: Rolling walker (2 wheeled) Transfers: Sit to/from [facility] Sit to Stand: Min assist;Mod assist Stand pivot transfers: Min guard       General transfer comment:  (Increased assistance required with increased reps)           Cognition Arousal/Alertness: Awake/alert Behavior During Therapy: WFL for tasks assessed/performed Overall Cognitive Status: Within Functional Limits for tasks assessed                      Exercises General Exercises - Lower Extremity Ankle  Circles/Pumps: AROM;Both;20 reps;Supine Heel Slides: AROM;Both;20 reps;Supine Hip ABduction/ADduction: AAROM;Both;20 reps;Supine Other Exercises Other Exercises: Sit <-> stand, x2 with min assist, x2 with mod assist and use of RW        Pertinent Vitals/Pain No pain reported with sitting in chair today.            PT Goals (current goals can now be found in the care plan section) Progress towards PT goals: Progressing toward goals    Frequency  Min 3X/week    PT Plan Current plan remains appropriate       End of Session Equipment Utilized During Treatment: Gait belt;Oxygen Activity Tolerance: Patient limited by fatigue Patient left: in chair;with call bell/phone within reach;with chair alarm set;Other (comment) (Nurse aide notified patient left in chair)     Time: [phone] PT Time Calculation (min): 26 min  Charges:  $Therapeutic Exercise: 8-22 mins $Therapeutic Activity: 8-22 mins                     Mychele Seyller 11/04/2013, 3:06 PM

## 2013-11-05 ENCOUNTER — Inpatient Hospital Stay
Admission: RE | Admit: 2013-11-05 | Discharge: 2013-11-20 | Disposition: A | Discharge status: Home / Self Care | Payer: Medicare Other | Source: Ambulatory Visit | Attending: Internal Medicine | Admitting: Internal Medicine

## 2013-11-05 LAB — BASIC METABOLIC PANEL
BUN: 32 mg/dL — AB (ref 6–23)
CALCIUM: 9.7 mg/dL (ref 8.4–10.5)
CHLORIDE: 105 meq/L (ref 96–112)
CO2: 28 mEq/L (ref 19–32)
Creatinine, Ser: 1.99 mg/dL — ABNORMAL HIGH (ref 0.50–1.35)
GFR calc Af Amer: 34 mL/min — ABNORMAL LOW (ref >90)
GFR calc non Af Amer: 29 mL/min — ABNORMAL LOW (ref >90)
Glucose, Bld: 79 mg/dL (ref 70–99)
Potassium: 3.7 mEq/L (ref 3.7–5.3)
Sodium: 144 mEq/L (ref 137–147)

## 2013-11-05 LAB — CALCIUM, IONIZED: Calcium, Ion: 1.39 mmol/L — ABNORMAL HIGH (ref 1.13–1.30)

## 2013-11-05 MED ORDER — NIFEDIPINE ER OSMOTIC RELEASE 90 MG PO TB24: 90.0000 mg | ORAL_TABLET | Freq: Every day | ORAL | Status: DC

## 2013-11-05 MED ORDER — LABETALOL HCL 200 MG PO TABS: 200.0000 mg | ORAL_TABLET | Freq: Two times a day (BID) | ORAL | Status: DC

## 2013-11-05 MED ORDER — NYSTATIN 100000 UNIT/ML MT SUSP: 5.0000 mL | Freq: Three times a day (TID) | OROMUCOSAL | Status: DC

## 2013-11-05 MED ORDER — LEVOFLOXACIN 500 MG PO TABS: 500.0000 mg | ORAL_TABLET | Freq: Every day | ORAL | Status: DC

## 2013-11-05 MED ORDER — HYDROCODONE-ACETAMINOPHEN 5-325 MG PO TABS: 1.0000 | ORAL_TABLET | Freq: Four times a day (QID) | ORAL | Status: DC | PRN

## 2013-11-05 MED ORDER — FUROSEMIDE 40 MG PO TABS: 40.0000 mg | ORAL_TABLET | Freq: Two times a day (BID) | ORAL | Status: DC

## 2013-11-05 MED ORDER — DSS 100 MG PO CAPS: 100.0000 mg | ORAL_CAPSULE | Freq: Two times a day (BID) | ORAL | Status: AC

## 2013-11-05 MED ORDER — ENSURE COMPLETE PO LIQD: 237.0000 mL | Freq: Two times a day (BID) | ORAL | Status: DC

## 2013-11-05 MED ORDER — ALBUTEROL SULFATE (2.5 MG/3ML) 0.083% IN NEBU: 2.5000 mg | INHALATION_SOLUTION | RESPIRATORY_TRACT | Status: DC | PRN

## 2013-11-05 MED ORDER — HYDRALAZINE HCL 50 MG PO TABS: 50.0000 mg | ORAL_TABLET | Freq: Three times a day (TID) | ORAL | Status: DC

## 2013-11-05 NOTE — Discharge Summary (Signed)
Physician Discharge Summary  Dantre Yearwood HQI:[id number] DOB: July 15, 1926 DOA: 10/27/2013  PCP: [name], MD  Admit date: 10/27/2013 Discharge date: 11/05/2013  Time spent: 30 minutes  Recommendations for Outpatient Follow-up:  1. Follow up with PCP in one week 2. Follow up with Dr Hinda Lenis by the end of the week.  3. Follow up with Dr early as recommended for ICA STENOSIS 4. PLEASE obtain BMP tomorrow to check calcium and renal parameters.    Discharge Diagnoses:  Principal Problem:   Weakness Active Problems:   Coronary atherosclerosis of native coronary artery   Essential hypertension, benign   Peripheral arterial disease   Chronic kidney disease, stage III (moderate)   Low-Grade, B-Cell, Follicular lymphoma   Hypercalcemia   HCAP (healthcare-associated pneumonia)   Fever, unspecified   Moderate malnutrition   Discharge Condition: improved.   Diet recommendation: low sodium diet.   Filed Weights   10/27/13 2130 11/03/13 0416 11/04/13 0446  Weight: 71.215 kg (157 lb) 79 kg (174 lb 2.6 oz) 77.5 kg (170 lb 13.7 oz)    History of present illness:  80 year old male who has a past medical history of Coronary atherosclerosis of native coronary artery; Essential hypertension, benign; Non Hodgkin's lymphoma; Chronic renal insufficiency; Hyperlipidemia; Degenerative disc disease, lumbar; Duodenal ulcer; Pericardial effusion; Carotid artery disease; Stroke; Peripheral arterial disease; Enlarged prostate; Lymphoma; and Melanoma.  Today presents to the ED after patient fell yesterday morning with associated fatigue. As per patient's wife patient got up around 3 AM yesterday morning and was trying to sit on a rolling computer chair when patient fell. Patient hit his left hip and left elbow no head injury,He was unable to get up by himself. In the ED patient found to have hypercalcemia with calcium 11.8, x-ray of the left hip, left elbow were negative for fracture. Patient denies any  pain at this time, able to move all extremities. CT head and chest x-ray also done which were negative. MRI on 6/8 did not show any acute changes. Renal consulted and recommendations given. On 6/11 pt developed low grade fever, was found to have mild onset of hcap. Started the patient on IV vancomycin and IV cefepime. Repeat CXR shows improving pneumonia. Will transition to oral levaquin.    Hospital Course:  1. Failure to Thrive/Functional Decline -Initial workup unremarkable so far. He had a negative head CT, CXR did not show infiltrate, u/a was unremarkable,no source of infection, nonfocal exam.  -Had MRI on 10/28/2013 which did not show acute changes.  - PTH, SPEP, UPEP results are back.   intact PTH is low.  -PT consultation placed, may benefit from rehab at SNF  2. Hypercalcemia  -Calcium at 9.6 Improving. On pamidronate one dose given on 6/13.  -Possibly related to CKD  Continue lasix as per renal. Further management as per renal as outpatient. Discussed with Dr Hinda Lenis.  3. Murmur  -He appeared to have a more pronounced harsh systolic murmur on admission.  -Recent 2D Echo on 08/29/2013 showed EF of 60-65% without significant vavular disease,aortic valve sclerosis without stenosis.  -Repeat Echo show EF of 75%. With grade 1 diastolic dysfunction. Mild LVH.  4. HTN  -Blood pressures remain elevated, but much better controlled.I so far we have increased his Hydralazine to 50 mg PO TID from q daily.  -increased Nifedipine to 90 mg PO q daily and labetolol added by renal.  5. Low Grade B-Cell Lymphoma  -Medical oncology consulted, stable  6. Stage III CKD  -Creatinine stable  -Will  continue IV fluids and lasix as per renal recommendations.   7. ICA stenosis  -Patient having carotid dopplers 10/20/2011 showing right ICA stenosis 50-69%, left ICA stenosis 70% stenosis.  -MRI on this admission did not show acute CVA. Nonfocal exam, appears to have presented with generalized weakness.   -Outpatient vascular surgery follow, has seen Dr Donnetta Hutching in the clinic.   Fever:  - CXR portable showed right basilar infiltrate. Started the patient on IV vancomycin and IV cefepime.  - blood cultures are drawn and pending. repat CXR in am Shows improvement in the pneumonia.  Transition to oral antibiotics. Moderate malnutrition:  Nutrition consulted and recommendations given.    Procedures:  none  Consultations:  Renal   Discharge Exam: Filed Vitals:   11/05/13 0457  BP: 173/51  Pulse: 81  Temp: 97.9 F (36.6 C)  Resp: 20    General: alert afebrile, comfortable.  Cardiovascular: s1s2 Respiratory: ctab.  Discharge Instructions You were cared for by a hospitalist during your hospital stay. If you have any questions about your discharge medications or the care you received while you were in the hospital after you are discharged, you can call the unit and asked to speak with the hospitalist on call if the hospitalist that took care of you is not available. Once you are discharged, your primary care physician will handle any further medical issues. Please note that NO REFILLS for any discharge medications will be authorized once you are discharged, as it is imperative that you return to your primary care physician (or establish a relationship with a primary care physician if you do not have one) for your aftercare needs so that they can reassess your need for medications and monitor your lab values.  Discharge Instructions   Diet - low sodium heart healthy    Complete by:  As directed      Discharge instructions    Complete by:  As directed   Follow up with PCP in one week Follow up with Dr Hinda Lenis as recommended.  Please check BMP in am.            Medication List         albuterol (2.5 MG/3ML) 0.083% nebulizer solution  Commonly known as:  PROVENTIL  Take 3 mLs (2.5 mg total) by nebulization every 4 (four) hours as needed for wheezing or shortness of breath.      ALPRAZolam 0.5 MG tablet  Commonly known as:  XANAX  Take 0.5 mg by mouth 4 (four) times daily.     clopidogrel 75 MG tablet  Commonly known as:  PLAVIX  Take 75 mg by mouth daily with breakfast.     doxazosin 4 MG tablet  Commonly known as:  CARDURA  Take 4 mg by mouth daily.     DSS 100 MG Caps  Take 100 mg by mouth 2 (two) times daily.     feeding supplement (ENSURE COMPLETE) Liqd  Take 237 mLs by mouth 2 (two) times daily between meals.     furosemide 40 MG tablet  Commonly known as:  LASIX  Take 1 tablet (40 mg total) by mouth 2 (two) times daily.     hydrALAZINE 50 MG tablet  Commonly known as:  APRESOLINE  Take 1 tablet (50 mg total) by mouth every 8 (eight) hours.     HYDROcodone-acetaminophen 5-325 MG per tablet  Commonly known as:  NORCO/VICODIN  Take 1 tablet by mouth every 6 (six) hours as needed. pain  labetalol 200 MG tablet  Commonly known as:  NORMODYNE  Take 1 tablet (200 mg total) by mouth 2 (two) times daily.     levofloxacin 500 MG tablet  Commonly known as:  LEVAQUIN  Take 1 tablet (500 mg total) by mouth daily.     lovastatin 40 MG tablet  Commonly known as:  MEVACOR  Take 40 mg by mouth at bedtime.     NIFEdipine 90 MG 24 hr tablet  Commonly known as:  PROCARDIA XL/ADALAT-CC  Take 1 tablet (90 mg total) by mouth daily.     nystatin 100000 UNIT/ML suspension  Commonly known as:  MYCOSTATIN  Take 5 mLs (500,000 Units total) by mouth 4 (four) times daily -  before meals and at bedtime.     pantoprazole 40 MG tablet  Commonly known as:  PROTONIX  Take 40 mg by mouth daily.     tamsulosin 0.4 MG Caps capsule  Commonly known as:  FLOMAX  Take 0.4 mg by mouth at bedtime.     venlafaxine XR 150 MG 24 hr capsule  Commonly known as:  EFFEXOR-XR  Take 150 mg by mouth daily with breakfast.       Allergies  Allergen Reactions  . Aspirin     Causes stomach bleeds       Follow-up Information   Follow up with KEFALAS,THOMAS, PA-C On  11/20/2013. (Follow-up appointment)    Specialty:  Physician Assistant   Contact information:   [address] [place] [address] [phone]        The results of significant diagnostics from this hospitalization (including imaging, microbiology, ancillary and laboratory) are listed below for reference.    Significant Diagnostic Studies: Dg Chest 1 View  10/28/2013   CLINICAL DATA:  Left-sided pain after a fall 3 days ago.  EXAM: CHEST - 1 VIEW  COMPARISON:  01/16/2012  FINDINGS: Shallow inspiration. Heart size and pulmonary vascularity are normal. There appear to be calcified pleural plaques over the right hemidiaphragm. No focal airspace disease or consolidation in the lungs. Visualized ribs are nondisplaced. No pneumothorax.  IMPRESSION: No active disease.   Electronically Signed   By: Lucienne Capers M.D.   On: 10/28/2013 01:00   Dg Chest 2 View  11/03/2013   CLINICAL DATA:  Shortness of breath.  EXAM: CHEST  2 VIEW  COMPARISON:  10/31/2013 .  FINDINGS: Mediastinum and hilar structures normal. Cardiomegaly with normal pulmonary vascularity. Interim clearing of mild basilar atelectasis. No focal infiltrate. Persistent right pleural effusion best seen on lateral view. No pneumothorax. Mild right base pleural parenchymal thickening noted consistent with scarring.  IMPRESSION: 1. Interim clearing of right base atelectasis. Persistent right pleural effusion, best seen on lateral view.  2.  Stable cardiomegaly .   Electronically Signed   By: [place]   On: 11/03/2013 10:45   Dg Elbow Complete Left  10/28/2013   CLINICAL DATA:  Left-sided pain after a fall 3 days ago.  EXAM: LEFT ELBOW - COMPLETE 3+ VIEW  COMPARISON:  None.  FINDINGS: Small olecranon spur. There is no evidence of fracture, dislocation, or joint effusion. There is no evidence of arthropathy or other focal bone abnormality. Soft tissues are unremarkable.  IMPRESSION: Negative.   Electronically Signed   By: Lucienne Capers  M.D.   On: 10/28/2013 00:58   Dg Hip Complete Left  10/28/2013   CLINICAL DATA:  Left-sided pain after a fall 3 days ago.  EXAM: LEFT HIP - COMPLETE 2+ VIEW  COMPARISON:  None.  FINDINGS: There is no evidence of hip fracture or dislocation. There is no evidence of arthropathy or other focal bone abnormality. Surgical clips over the hips.  IMPRESSION: Negative.   Electronically Signed   By: Lucienne Capers M.D.   On: 10/28/2013 00:57   Ct Head Wo Contrast  10/28/2013   CLINICAL DATA:  Trauma 3 days ago with left arm pain.  EXAM: CT HEAD WITHOUT CONTRAST  TECHNIQUE: Contiguous axial images were obtained from the base of the skull through the vertex without intravenous contrast.  COMPARISON:  MRI of the brain August 26, 2013.  FINDINGS: No intraparenchymal hemorrhage, mass effect or midline shift. Left medial occipital lobe encephalomalacia, unchanged. Bilateral subcentimeter basal ganglia remote lacunar infarcts. Remote right cerebellar small infarct. The ventricles and sulci are overall normal for patient's age. No acute large vascular territory infarct. Patchy white matter hypodensities unchanged, suggest sequelae of chronic small vessel ischemic disease, within normal range for patient's age.  No abnormal extra-axial fluid collections. Moderate calcific atherosclerosis of the carotid siphons and to lesser extent vertebral arteries. Ocular globes and orbital contents are nonsuspicious though not tailored for evaluation. No skull fracture. Remote left medial orbital blowout fracture. Moderate right temporomandibular osteoarthrosis. Mild paranasal sinus mucosal thickening without air-fluid levels. Mastoid air cells are well aerated.  IMPRESSION: No acute intracranial process.  Stable chronic changes, including remote left posterior cerebral artery territory infarct.   Electronically Signed   By: Elon Alas   On: 10/28/2013 00:55   Mr Brain Wo Contrast  10/28/2013   CLINICAL DATA:  Weakness.  Evaluate for  stroke.  EXAM: MRI HEAD WITHOUT CONTRAST  TECHNIQUE: Multiplanar, multiecho pulse sequences of the brain and surrounding structures were obtained without intravenous contrast.  COMPARISON:  Head CT 10/28/2013 and MRI 08/26/2013  FINDINGS: There is no acute infarct. Old left PCA territory infarct is again noted involving the left occipital lobe with evidence of remote hemorrhage. Small, remote infarcts are also again seen in the right cerebellum and bilateral basal ganglia. Patchy T2 hyperintensities in the subcortical and deep cerebral white matter and pons are unchanged and compatible with mild chronic small vessel ischemic disease. Mild to moderate cerebral atrophy is unchanged. There is no evidence of mass, midline shift, or extra-axial fluid collection.  Prior bilateral cataract surgery is noted. Paranasal sinuses and mastoid air cells are clear. Major intracranial vascular flow voids are preserved.  IMPRESSION: 1. No evidence of acute intracranial abnormality. 2. Unchanged, chronic infarcts and small vessel ischemic disease.   Electronically Signed   By: Logan Bores   On: 10/28/2013 08:44   Dg Chest Port 1 View  10/31/2013   CLINICAL DATA:  Fever  EXAM: PORTABLE CHEST - 1 VIEW  COMPARISON:  10/27/2013  FINDINGS: Cardiomediastinal silhouette is stable. No pulmonary edema. There is streaky right basilar atelectasis or infiltrate.  IMPRESSION: No pulmonary edema. Streaky right basilar atelectasis or infiltrate.   Electronically Signed   By: Lahoma Crocker M.D.   On: 10/31/2013 11:55    Microbiology: Recent Results (from the past 240 hour(s))  CULTURE, BLOOD (ROUTINE X 2)     Status: None   Collection Time    10/31/13 11:05 AM      Result Value Ref Range Status   Specimen Description BLOOD RIGHT ARM   Final   Special Requests BOTTLES DRAWN AEROBIC AND ANAEROBIC 10CC BOTTLES   Final   Culture NO GROWTH 4 DAYS   Final   Report Status PENDING  Incomplete  CULTURE, BLOOD (ROUTINE X 2)     Status: None    Collection Time    10/31/13 11:14 AM      Result Value Ref Range Status   Specimen Description BLOOD RIGHT HAND   Final   Special Requests BOTTLES DRAWN AEROBIC AND ANAEROBIC 8CC BOTTLES   Final   Culture NO GROWTH 4 DAYS   Final   Report Status PENDING   Incomplete     Labs: Basic Metabolic Panel:  Recent Labs Lab 11/01/13 0640 11/02/13 0502 11/03/13 0536 11/04/13 0429 11/05/13 1050  NA 143 142 141 145 144  K 3.8 3.6* 3.6* 3.6* 3.7  CL 105 103 102 105 105  CO2 26 25 25 28 28   GLUCOSE 93 91 97 100* 79  BUN 29* 31* 34* 33* 32*  CREATININE 1.87* 1.95* 2.00* 2.07* 1.99*  CALCIUM 10.8* 10.6* 10.7* 10.6* 9.7  PHOS 3.2  --   --   --   --    Liver Function Tests: No results found for this basename: AST, ALT, ALKPHOS, BILITOT, PROT, ALBUMIN,  in the last 168 hours No results found for this basename: LIPASE, AMYLASE,  in the last 168 hours No results found for this basename: AMMONIA,  in the last 168 hours CBC:  Recent Labs Lab 10/30/13 0546 11/01/13 0640 11/04/13 0429  WBC 7.2 9.6 5.8  HGB 10.9* 9.9* 9.7*  HCT 34.5* 31.8* 31.0*  MCV 88.9 88.1 88.1  PLT 206 221 237   Cardiac Enzymes: No results found for this basename: CKTOTAL, CKMB, CKMBINDEX, TROPONINI,  in the last 168 hours BNP: BNP (last 3 results) No results found for this basename: PROBNP,  in the last 8760 hours CBG: No results found for this basename: GLUCAP,  in the last 168 hours     Signed:  AKULA,VIJAYA  [facility] 11/05/2013, 1:39 PM

## 2013-11-05 NOTE — Clinical Social Work Placement (Signed)
Clinical Social Work Department CLINICAL SOCIAL WORK PLACEMENT NOTE 11/05/2013  Patient:  Ricardo Schaefer, Ricardo Schaefer  Account Number:  [id number] Admit date:  10/27/2013  Clinical Social Worker:  Benay Pike, LCSW  Date/time:  10/29/2013 02:18 PM  Clinical Social Work is seeking post-discharge placement for this patient at the following level of care:   [place]   (*CSW will update this form in Epic as items are completed)   10/29/2013  Patient/family provided with [place] Department of Clinical Social Work's list of facilities offering this level of care within the geographic area requested by the patient (or if unable, by the patient's family).  10/29/2013  Patient/family informed of their freedom to choose among providers that offer the needed level of care, that participate in Medicare, Medicaid or managed care program needed by the patient, have an available bed and are willing to accept the patient.  10/29/2013  Patient/family informed of [facility]' ownership interest in [facility], as well as of the fact that they are under no obligation to receive care at this facility.  PASARR submitted to EDS on 10/29/2013 PASARR number received on 10/29/2013  FL2 transmitted to all facilities in geographic area requested by pt/family on  10/29/2013 FL2 transmitted to all facilities within larger geographic area on   Patient informed that his/her managed care company has contracts with or will negotiate with  certain facilities, including the following:     Patient/family informed of bed offers received:  10/30/2013 Patient chooses bed at [facility] Physician recommends and patient chooses bed at  [facility]  Patient to be transferred to [facility] on  11/05/2013 Patient to be transferred to facility by RN Patient and family notified of transfer on 11/05/2013 Name of family member notified:  Stanton Kidney- wife  The following physician request were entered  in Epic:   Additional Comments:  Benay Pike, [place]

## 2013-11-05 NOTE — Progress Notes (Signed)
Ricardo Schaefer  MRN: [id number]  DOB/AGE: Dec 23, 1926 80 y.o.  Primary Care Physician:PICKARD,WARREN TOM, MD  Admit date: 10/27/2013  Chief Complaint:  Chief Complaint  Patient presents with  . Fatigue  . Fall    S-Pt presented on  10/27/2013 with  Chief Complaint  Patient presents with  . Fatigue  . Fall  .    Pt today feels better  Meds . ALPRAZolam  0.5 mg Oral QID  . clopidogrel  75 mg Oral Q breakfast  . docusate sodium  100 mg Oral BID  . doxazosin  4 mg Oral Daily  . enoxaparin (LOVENOX) injection  30 mg Subcutaneous Q24H  . feeding supplement (ENSURE COMPLETE)  237 mL Oral BID BM  . furosemide  40 mg Intravenous BID  . hydrALAZINE  50 mg Oral 3 times per day  . labetalol  200 mg Oral BID  . levofloxacin  500 mg Oral Daily  . NIFEdipine  90 mg Oral Daily  . nystatin  5 mL Oral TID AC & HS  . pantoprazole  40 mg Oral Daily  . simvastatin  20 mg Oral q1800  . tamsulosin  0.4 mg Oral QHS  . venlafaxine XR  150 mg Oral Q breakfast      Physical Exam: Vital signs in last 24 hours: Temp:  [97.9 F (36.6 C)-98.7 F (37.1 C)] 98.2 F (36.8 C) (06/16 1517) Pulse Rate:  [71-81] 71 (06/16 1517) Resp:  [18-20] 18 (06/16 1517) BP: (157-174)/(32-56) 157/32 mmHg (06/16 1517) SpO2:  [96 %-99 %] 99 % (06/16 1517) Weight change:  Last BM Date: 11/04/13  Intake/Output from previous day: 06/15 0701 - 06/16 0700 In: 480 [P.O.:480] Out: 1700 [Urine:1700]     Physical Exam: General- pt is awake,alert. Resp- No acute REsp distress, CTA B/L NO Rhonchi CVS- S1S2 regular in rate and rhythm GIT- BS+, soft, NT, ND EXT- Trace LE Edema,NO Cyanosis   Lab Results: CBC  Recent Labs  11/04/13 0429  WBC 5.8  HGB 9.7*  HCT 31.0*  PLT 237    BMET  Recent Labs  11/04/13 0429 11/05/13 1050  NA 145 144  K 3.6* 3.7  CL 105 105  CO2 28 28  GLUCOSE 100* 79  BUN 33* 32*  CREATININE 2.07* 1.99*  CALCIUM 10.6* 9.7   Trend Creat  2015 3.0=>1.97==>2.0 2014 1.8--2.1   2013 1.7  Calcium  2015 11.8=>11.6=>11.5 ==>10.6=>9.7 2014 10.8  2013 10.3--10.5   MICRO Recent Results (from the past 240 hour(s))  CULTURE, BLOOD (ROUTINE X 2)     Status: None   Collection Time    10/31/13 11:05 AM      Result Value Ref Range Status   Specimen Description BLOOD RIGHT ARM   Final   Special Requests BOTTLES DRAWN AEROBIC AND ANAEROBIC 10CC BOTTLES   Final   Culture NO GROWTH 4 DAYS   Final   Report Status PENDING   Incomplete  CULTURE, BLOOD (ROUTINE X 2)     Status: None   Collection Time    10/31/13 11:14 AM      Result Value Ref Range Status   Specimen Description BLOOD RIGHT HAND   Final   Special Requests BOTTLES DRAWN AEROBIC AND ANAEROBIC 8CC BOTTLES   Final   Culture NO GROWTH 4 DAYS   Final   Report Status PENDING   Incomplete      Lab Results  Component Value Date   PTH 7.0* 10/31/2013   CALCIUM 9.7 11/05/2013   PHOS  3.2 11/01/2013       Impression: 1)Renal   CKD stage 4 .  CKD since 2013( Most likley before that)  CKD secondary to HTN/ Age ass decline/ Hypercaclemia ass AKI ?  Progression of CKD slow  Hematuria none.  Nephrolithiasis Hx Absent      2)HTN Medication- On Diuretics- On Calcium Channel Blockers On Alpha and beta Blockers On Vasodilators  3)Anemia HGb at goal (9--11)   4)CKD Mineral-Bone Disorder PTH suppressed Secondary Hyperparathyroidism absent. Phosphorus at goal.   5)Hypercalcemia Etiology Vitamin D related as Vitamin 1,25 oh on higher side Pth low as expected Now better  6)Electrolytes Normokalemic NOrmonatremic   7)Acid base Co2 at goal     Plan:  Agree with tx and plan.       BHUTANI,MANPREET S 11/05/2013, 4:23 PM

## 2013-11-05 NOTE — Clinical Social Work Note (Signed)
Pt d/c today to [facility]. Pt, pt's wife, and facility aware and agreeable. D/C summary faxed. [employer] notified yesterday of anticipated d/c today. Pt to transfer with RN.  Benay Pike, [place]

## 2013-11-05 NOTE — Progress Notes (Signed)
Patient transfered to [facility], room 127. Report given to admission nurse. Vital signs stable at transfer.

## 2013-11-06 ENCOUNTER — Non-Acute Institutional Stay (SKILLED_NURSING_FACILITY): Payer: Medicare Other | Admitting: Internal Medicine

## 2013-11-06 DIAGNOSIS — G1221 Amyotrophic lateral sclerosis: Secondary | ICD-10-CM

## 2013-11-06 DIAGNOSIS — G1229 Other motor neuron disease: Secondary | ICD-10-CM

## 2013-11-06 DIAGNOSIS — N183 Chronic kidney disease, stage 3 unspecified: Secondary | ICD-10-CM

## 2013-11-06 LAB — CULTURE, BLOOD (ROUTINE X 2)
CULTURE: NO GROWTH
Culture: NO GROWTH

## 2013-11-06 LAB — PTH-RELATED PEPTIDE: PTH-RELATED PEPTIDE: 15 pg/mL (ref 14–27)

## 2013-11-14 DIAGNOSIS — G1221 Amyotrophic lateral sclerosis: Principal | ICD-10-CM

## 2013-11-14 DIAGNOSIS — G1229 Other motor neuron disease: Secondary | ICD-10-CM | POA: Insufficient documentation

## 2013-11-14 NOTE — Progress Notes (Signed)
Patient ID: Ricardo Schaefer, male   DOB: 1926-08-15, 80 y.o.   MRN: [id number]                  HISTORY & PHYSICAL  DATE:  11/06/2013    FACILITY: [facility]    LEVEL OF CARE:   SNF   CHIEF COMPLAINT:  Admission to SNF, post stay at [facility], 10/27/2013 through 11/05/2013.    HISTORY OF PRESENT ILLNESS:  This is an 80 year-old man with multiple medical issues, including non-Hodgkin's lymphoma.    He was admitted to hospital after suffering a fall and complaining of weakness and fatigue.  He was not injured, although he did fall and hit his left hip and left elbow.  In the emergency room, he was found to have a calcium of 11.8 and albumin in the low 3 range.  CT scan of the head and also an MRI did not show any acute changes.  He was given a single dose of pamidronate under the direction of Nephrology, Dr. Hinda Lenis.  The calcium has come down.  His PTH went up.    He was also discovered to have a chest x-ray showing a right basilar infiltrate.  He received treatment for healthcare-acquired pneumonia and was discharged on oral antibiotics.  Overall, his mental status seems to have improved.    PAST MEDICAL HISTORY/PROBLEM LIST:  Failure to thrive/functional decline.  He had a work-up, including a suppressed PTH of 5.5.  His SPEP and UPEP did not show M-spikes.  He had a 25-hydroxy D level of 31.  His 125-hydroxy vitamin D level was 73, which is just above the upper level of the normal range.  As mentioned, his albumin was 3.    Hypercalcemia.  At discharge, it was 9.6.  He was given one dose of pamidronate on 11/02/2013.  Stated that this was related to chronic kidney disease,  although I think there would need to be other factors at play here for chronic renal failure to cause hypercalcemia.    Heart murmur.  This shows aortic valve sclerosis without stenosis.  His EF was 60-65%.    Hypertension.  Hydralazine, nifedipine, and labetalol were added.    Low-grade B-cell lymphoma.   Medical Oncology consulted.    Chronic renal failure stage III.    Internal carotid stenosis.  Carotid dopplers from May 30th showed right internal carotid artery stenosis at 50-69% and left internal carotid stenosis at 70%.  MRI on this admission showed no CVA.  Outpatient Vascular Surgery, he apparently has seen Dr. Donnetta Hutching in the past.    Pneumonia.  Successfully treated.    CURRENT MEDICATIONS:  Discharge medications include:     Proventil 3 mL by nebulizer every 4 hours.    Xanax 0.5 four times daily.    Plavix 75 q.d.     Cardura 4 mg daily.    Colace 100 b.i.d.    Lasix 40 b.i.d.    Hydralazine 50 every 8 hours.    Labetalol 200 two times a day.    Levaquin 500 daily (pneumonia).    Mevacor 40 mg daily.    Procardia XL 90 mg daily.    Nystatin 5 mL by mouth four times daily before and after bedtime.    Protonix 40 q.d.    Flomax 0.4 q.d.    Effexor XR 150 q.d.    SOCIAL HISTORY: HOUSING:  The patient tells me he lives in [place] with his wife.   FUNCTIONAL  STATUS:  I am not certain of his functional status.    FAMILY HISTORY:  Positive for coronary artery disease.    REVIEW OF SYSTEMS:   HEENT:  The patient denies headache.   CHEST/RESPIRATORY:  No cough.  No sputum.   CARDIAC:   No chest pain.   GI:  States he is constipated.   GU:  Urinary frequency without dysuria.   NEUROLOGICAL:   He tells me he has not been on his feet since the hospitalization.    PHYSICAL EXAMINATION:   VITAL SIGNS:       PULSE:  80.    RESPIRATIONS:  18 and unlabored.   O2 SATURATIONS:  95% on 2 L.   GENERAL APPEARANCE:  The patient is alert, conversational.   Somewhat hard of hearing.   HEENT:   MOUTH/THROAT:   Dry mucous membranes.  Possible thrush.   I would wonder about black hairy tongue.   CHEST/RESPIRATORY:  Clear air entry bilaterally.   CARDIOVASCULAR:  CARDIAC:   Heart sounds are normal.  There is a systolic ejection murmur that does not radiate.  He appears  to be somewhat volume-contracted.   GASTROINTESTINAL:  ABDOMEN:   Distended.  Surgical scars noted.   LIVER/SPLEEN/KIDNEYS:  His liver is not palpable.  There is a spleen tip palpable.   GENITOURINARY:  BLADDER:   Somewhat distended.   CIRCULATION:  EDEMA/VARICOSITIES:  Extremities:  He has no edema.   ARTERIAL:  Probably some degree of PAD.   SKIN:  INSPECTION:  No wounds are seen.   NEUROLOGICAL:    SENSATION/STRENGTH:  He has antigravity strength bilaterally.   DEEP TENDON REFLEXES:  He is diffusely hyperreflexic, 3+ at both knee jerks, clonus at both ankle jerks, although his Babinski response is flexor.  There is no Hoffmann's reflex.   PSYCHIATRIC:   MENTAL STATUS:   I really detect no abnormalities at this point.    ASSESSMENT/PLAN:  Failure to thrive/functional decline, described in the hospital.  He was found to be hypercalcemic with a suppressed PTH.  This would suggest this is not a PTH-mediated phenomenon.  I would think this is most likely secondary to his lymphoma.  He has been treated with pamidronate.    Chronic renal failure.  This does not usually cause hypercalcemia unless there are other issues here.  His admission creatinine was 1.97.  At discharge, this was 1.99, BUN of 32.    Hypercalcemia.    See discussion above.    Refractory hypertension.    Anemia.  His hemoglobin was 9.7 at discharge.  He had iron studies showing an iron of 14, a TIBC of 241, a ferritin level of 110, and a saturation of 6.  This is likely iron deficiency.    Cerebral artery stenosis.  On Plavix, medical management.     Stage III chronic renal insufficiency.  As noted.    I am going to check this man for urinary retention with a postvoid bladder ultrasound.  I would like to see him on his feet.  The cause of his bilateral hyperreflexia, which was clearly pathologic in both of his legs, is not clear at the time of this dictation.  He did have an MRI of the brain on 10/28/2013 which showed  chronic infarcts and small vessel ischemic disease.  I am not sure if this is the explanation or not.    CPT CODE: [phone]

## 2013-11-17 NOTE — Progress Notes (Signed)
Ricardo Fraction, MD [address] [place] [address]  Low-Grade, B-Cell, Follicular lymphoma - Plan: CT Abdomen Pelvis W Contrast, CT Chest W Contrast, CT Soft Tissue Neck W Contrast  CURRENT THERAPY: Surveillance per NCCN guidelines  INTERVAL HISTORY: Ricardo Schaefer 80 y.o. male returns for  regular  visit for followup of low-grade B-cell follicular lymphoma diagnosed by inguinal lymph node biopsy, not having received any therapy.   Ricardo Schaefer was admitted to the [facility] 6/7- 11/06/2013 for weakness and in the hospital, he was noted to be hypercalcemic, in addition to his other chronic diseases.  PTH was suppressed.  He was given Pamidronate on 6/13.  I personally reviewed and went over laboratory results with the patient.  The results are noted within this dictation.  I personally reviewed and went over radiographic studies with the patient.  The results are noted within this dictation.   MRI brain performed on 6/8 demonstrates: No evidence of acute intracranial abnormality.  He is doing much better now that he is out of the hospital.  He expects discharge from [facility] today.  There is discussion in dictations that his hypercalcemia that was noted in the hospital is secondary to his follicular lymphoma.  Lymphoma-induced hypercalcemia usually is associated with an elevated Vitamin D level which is not the case in the patient's situation. In order to have hypercalcemia from follicular lymphoma, one typically has a significant amount of tumor burden which is not appreciated clinically.  With that being said, we will restage him to evaluate his tumor burden.  He is not a good protoplasm for intense treatment, but he may be a candidate for Rituxan alone versus Rituxan + Bendamustine.  He is agreeable to restaging scans and reviewing of that data from there.   Hematologically, he denies any complaints and ROS questioning is negative. He denies any B  symptoms.  Past Medical History  Diagnosis Date  . Coronary atherosclerosis of native coronary artery     Occluded RCA with collaterals, DES to LAD and circumflex 2005  . Essential hypertension, benign   . Non Hodgkin's lymphoma   . Chronic renal insufficiency   . Hyperlipidemia   . Degenerative disc disease, lumbar   . Duodenal ulcer     EGD 6/11 - iron deficiency anemia  . Pericardial effusion     Status post window  . Carotid artery disease     Nonobstructive, less than 64% LICA by angiography 4/03  . Stroke     Occurred 1999, had left atrial appendage thrombus at that time - previously on Coumadin  . Peripheral arterial disease   . Enlarged prostate   . Lymphoma   . Melanoma     has Coronary atherosclerosis of native coronary artery; Mixed hyperlipidemia; Essential hypertension, benign; Carotid artery disease; Peripheral arterial disease; Chest pain at rest; Chronic kidney disease, stage III (moderate); Low-Grade, B-Cell, Follicular lymphoma; Peripheral vascular disease, unspecified; Hypercalcemia; Weakness; HCAP (healthcare-associated pneumonia); Fever, unspecified; Moderate malnutrition; Upper motor neuron lesion; and HTN (hypertension) on his problem list.     is allergic to aspirin.  Ricardo Schaefer does not currently have medications on file.  Past Surgical History  Procedure Laterality Date  . Cholecystectomy    . Pericardial window    . Aortobifemoral bypass grafting  2005  . Left femoral popliteal bypass grafting  2005  . Coronary angioplasty with stent placement      Denies any headaches, dizziness, double vision, fevers, chills, night  sweats, nausea, vomiting, diarrhea, constipation, chest pain, heart palpitations, blood in stool, black tarry stool, urinary pain, urinary burning, urinary frequency, hematuria.   PHYSICAL EXAMINATION  ECOG PERFORMANCE STATUS: 2 - Symptomatic, <50% confined to bed  Filed Vitals:   11/20/13 1100  BP: 131/42  Pulse: 77  Temp:  98.5 F (36.9 C)  Resp: 16    GENERAL:alert, no distress, comfortable, cooperative, smiling and [place] in place administering O2 SKIN: skin color, texture, turgor are normal, no rashes or significant lesions HEAD: Normocephalic, No masses, lesions, tenderness or abnormalities EYES: normal, PERRLA, EOMI, Conjunctiva are pink and non-injected EARS: External ears normal OROPHARYNX:mucous membranes are moist  NECK: supple, no adenopathy, thyroid normal size, non-tender, without nodularity, no stridor, non-tender, trachea midline LYMPH:  no palpable lymphadenopathy BREAST:not examined LUNGS: positive findings: wheezing  Bilaterally with expiratory wheezing HEART: regular rate & rhythm ABDOMEN:abdomen soft, non-tender, obese and normal bowel sounds BACK: Back symmetric, no curvature. EXTREMITIES:less then 2 second capillary refill, no joint deformities, effusion, or inflammation, no skin discoloration, no clubbing, no cyanosis  NEURO: alert & oriented x 3 with fluent speech, no focal motor/sensory deficits, gait normal    LABORATORY DATA: CBC    Component Value Date/Time   WBC 6.0 11/18/2013 1100   RBC 3.70* 11/18/2013 1100   RBC 4.49 05/20/2013 0925   HGB 10.1* 11/18/2013 1100   HCT 32.3* 11/18/2013 1100   PLT 198 11/18/2013 1100   MCV 87.3 11/18/2013 1100   MCH 27.3 11/18/2013 1100   MCHC 31.3 11/18/2013 1100   RDW 14.8 11/18/2013 1100   LYMPHSABS 0.6* 11/18/2013 1100   MONOABS 0.7 11/18/2013 1100   EOSABS 0.1 11/18/2013 1100   BASOSABS 0.0 11/18/2013 1100      Chemistry      Component Value Date/Time   NA 141 11/18/2013 1100   K 4.4 11/18/2013 1100   CL 96 11/18/2013 1100   CO2 30 11/18/2013 1100   BUN 42* 11/18/2013 1100   CREATININE 1.81* 11/18/2013 1100      Component Value Date/Time   CALCIUM 9.5 11/18/2013 1100   CALCIUM 11.4* 10/30/2013 0728   ALKPHOS 91 11/18/2013 1100   AST 29 11/18/2013 1100   ALT 11 11/18/2013 1100   BILITOT 0.2* 11/18/2013 1100     Results for Ricardo Schaefer  (MRN [id number]) as of 11/17/2013 12:41  Ref. Range 10/27/2013 22:44  LDH Latest Range: 94-250 U/L 352 (H)      RADIOGRAPHIC STUDIES:  10/28/2013  CLINICAL DATA: Weakness. Evaluate for stroke.  EXAM:  MRI HEAD WITHOUT CONTRAST  TECHNIQUE:  Multiplanar, multiecho pulse sequences of the brain and surrounding  structures were obtained without intravenous contrast.  COMPARISON: Head CT 10/28/2013 and MRI 08/26/2013  FINDINGS:  There is no acute infarct. Old left PCA territory infarct is again  noted involving the left occipital lobe with evidence of remote  hemorrhage. Small, remote infarcts are also again seen in the right  cerebellum and bilateral basal ganglia. Patchy T2 hyperintensities  in the subcortical and deep cerebral white matter and pons are  unchanged and compatible with mild chronic small vessel ischemic  disease. Mild to moderate cerebral atrophy is unchanged. There is no  evidence of mass, midline shift, or extra-axial fluid collection.  Prior bilateral cataract surgery is noted. Paranasal sinuses and  mastoid air cells are clear. Major intracranial vascular flow voids  are preserved.  IMPRESSION:  1. No evidence of acute intracranial abnormality.  2. Unchanged, chronic infarcts and small vessel ischemic disease.  Electronically Signed  By: Logan Bores  On: 10/28/2013 08:44     ASSESSMENT:  1. Low grade, B-cell follicular lymphoma, not in need of treatment at present time and has not received treatment since diagnosis. He is a poor candidate for chemotherapeutic intervention. 2. Hypercalcemia, S/P pamidronate on 11/02/13 while admitted to the [facility].  Etiology unclear 3. Normocytic anemia, mild, stable.  Patient Active Problem List   Diagnosis Date Noted  . HTN (hypertension) 11/19/2013  . Upper motor neuron lesion 11/14/2013  . Moderate malnutrition 11/01/2013  . HCAP (healthcare-associated pneumonia) 10/31/2013  . Fever, unspecified 10/31/2013  .  Hypercalcemia 10/28/2013  . Weakness 10/28/2013  . Peripheral vascular disease, unspecified 09/18/2012  . Low-Grade, B-Cell, Follicular lymphoma 70/96/2836  . Chronic kidney disease, stage III (moderate) 01/22/2012  . Chest pain at rest 01/16/2012  . Coronary atherosclerosis of native coronary artery 04/21/2011  . Mixed hyperlipidemia 04/21/2011  . Essential hypertension, benign 04/21/2011  . Carotid artery disease 04/21/2011  . Peripheral arterial disease 04/21/2011     PLAN:  1. I personally reviewed and went over laboratory results with the patient.  The results are noted within this dictation. 2. I personally reviewed and went over radiographic studies with the patient.  The results are noted within this dictation.   3. Given patient's hypercalcemia, it is reasonable to restage him with CT CAP and neck with contrast. 4. In order to experience hypercalcemia from follicular lymphoma, there must be quite a bit of disease burden.  Therefore, we will stage him. 5. Return in 3 weeks for follow-up   THERAPY PLAN:  Given his hypercalcemia, and nephrology not believing this is renal in nature, we will stage him.  In order to have hypercalcemia from follicular lymphoma, one typically has significant disease burden.  If not present, his hypercalcemia is more likely renal in nature and he may benefit from nephrology follow-up and consideration of phosphate-binders.  If treatment is needed, he may be a candidate for Rituxan alone versus Bendamustine + Rituxan.  He is not a perfect candidate for chemotherapy.    All questions were answered. The patient knows to call the clinic with any problems, questions or concerns. We can certainly see the patient much sooner if necessary.  Patient and plan discussed with Dr. Farrel Gobble and he is in agreement with the aforementioned.   KEFALAS,THOMAS 11/20/2013

## 2013-11-18 ENCOUNTER — Other Ambulatory Visit: Payer: Self-pay | Admitting: *Deleted

## 2013-11-18 ENCOUNTER — Encounter (HOSPITAL_COMMUNITY): Payer: Medicare Other | Attending: Hematology and Oncology

## 2013-11-18 DIAGNOSIS — C8299 Follicular lymphoma, unspecified, extranodal and solid organ sites: Secondary | ICD-10-CM

## 2013-11-18 DIAGNOSIS — C8290 Follicular lymphoma, unspecified, unspecified site: Secondary | ICD-10-CM

## 2013-11-18 LAB — COMPREHENSIVE METABOLIC PANEL
ALK PHOS: 91 U/L (ref 39–117)
ALT: 11 U/L (ref 0–53)
AST: 29 U/L (ref 0–37)
Albumin: 2.8 g/dL — ABNORMAL LOW (ref 3.5–5.2)
BUN: 42 mg/dL — AB (ref 6–23)
CO2: 30 mEq/L (ref 19–32)
CREATININE: 1.81 mg/dL — AB (ref 0.50–1.35)
Calcium: 9.5 mg/dL (ref 8.4–10.5)
Chloride: 96 mEq/L (ref 96–112)
GFR calc non Af Amer: 32 mL/min — ABNORMAL LOW (ref >90)
GFR, EST AFRICAN AMERICAN: 38 mL/min — AB (ref >90)
Glucose, Bld: 90 mg/dL (ref 70–99)
POTASSIUM: 4.4 meq/L (ref 3.7–5.3)
Sodium: 141 mEq/L (ref 137–147)
TOTAL PROTEIN: 6.3 g/dL (ref 6.0–8.3)
Total Bilirubin: 0.2 mg/dL — ABNORMAL LOW (ref 0.3–1.2)

## 2013-11-18 LAB — CBC WITH DIFFERENTIAL/PLATELET
Basophils Absolute: 0 10*3/uL (ref 0.0–0.1)
Basophils Relative: 1 % (ref 0–1)
EOS PCT: 2 % (ref 0–5)
Eosinophils Absolute: 0.1 10*3/uL (ref 0.0–0.7)
HCT: 32.3 % — ABNORMAL LOW (ref 39.0–52.0)
HEMOGLOBIN: 10.1 g/dL — AB (ref 13.0–17.0)
Lymphocytes Relative: 10 % — ABNORMAL LOW (ref 12–46)
Lymphs Abs: 0.6 10*3/uL — ABNORMAL LOW (ref 0.7–4.0)
MCH: 27.3 pg (ref 26.0–34.0)
MCHC: 31.3 g/dL (ref 30.0–36.0)
MCV: 87.3 fL (ref 78.0–100.0)
MONO ABS: 0.7 10*3/uL (ref 0.1–1.0)
MONOS PCT: 12 % (ref 3–12)
Neutro Abs: 4.5 10*3/uL (ref 1.7–7.7)
Neutrophils Relative %: 75 % (ref 43–77)
Platelets: 198 10*3/uL (ref 150–400)
RBC: 3.7 MIL/uL — ABNORMAL LOW (ref 4.22–5.81)
RDW: 14.8 % (ref 11.5–15.5)
WBC: 6 10*3/uL (ref 4.0–10.5)

## 2013-11-18 LAB — LACTATE DEHYDROGENASE: LDH: 416 U/L — ABNORMAL HIGH (ref 94–250)

## 2013-11-18 MED ORDER — HYDROCODONE-ACETAMINOPHEN 5-325 MG PO TABS: ORAL_TABLET | ORAL | Status: AC

## 2013-11-18 NOTE — Progress Notes (Signed)
[name]'s reason for visit today is for labs as scheduled per MD orders.  Venipuncture performed with a 23 gauge butterfly needle to R Antecubital.  Ricardo Schaefer tolerated procedure well and without incident; questions were answered and patient was discharged.

## 2013-11-18 NOTE — Telephone Encounter (Signed)
[facility] 

## 2013-11-19 ENCOUNTER — Non-Acute Institutional Stay (SKILLED_NURSING_FACILITY): Payer: Medicare Other | Admitting: Internal Medicine

## 2013-11-19 ENCOUNTER — Encounter: Payer: Self-pay | Admitting: Internal Medicine

## 2013-11-19 DIAGNOSIS — R5381 Other malaise: Secondary | ICD-10-CM

## 2013-11-19 DIAGNOSIS — R531 Weakness: Secondary | ICD-10-CM

## 2013-11-19 DIAGNOSIS — N183 Chronic kidney disease, stage 3 unspecified: Secondary | ICD-10-CM

## 2013-11-19 DIAGNOSIS — C8299 Follicular lymphoma, unspecified, extranodal and solid organ sites: Secondary | ICD-10-CM

## 2013-11-19 DIAGNOSIS — C8290 Follicular lymphoma, unspecified, unspecified site: Secondary | ICD-10-CM

## 2013-11-19 DIAGNOSIS — I10 Essential (primary) hypertension: Secondary | ICD-10-CM

## 2013-11-19 DIAGNOSIS — R5383 Other fatigue: Secondary | ICD-10-CM

## 2013-11-19 NOTE — Progress Notes (Signed)
Patient ID: Ricardo Schaefer, male   DOB: [birth year], 80 y.o.   MRN: [id number]   FACILITY: [facility]: SNF This is a discharge note.    CHIEF COMPLAINT: Discharge note.  Marland Kitchen  HISTORY OF PRESENT ILLNESS: This is an 80 year-old man with multiple medical issues, including non-Hodgkin's lymphoma.  He was admitted to hospital after suffering a fall and complaining of weakness and fatigue. He was not injured, although he did fall and hit his left hip and left elbow. In the emergency room, he was found to have a calcium of 11.8 and albumin in the low 3 range. CT scan of the head and also an MRI did not show any acute changes. He was given a single dose of pamidronate under the direction of Nephrology, Dr. Hinda Lenis. The calcium has come down. His PTH went up.  He was also discovered to have a chest x-ray showing a right basilar infiltrate. He received treatment for healthcare-acquired pneumonia and was discharged on oral antibiotics. Overall, his mental status seems to have improved--he continues with significant weakness however apparently he is quite insistent about going home and his wife is now in agreement with this.  He will need extensive PT and OT as well as home health support for his medical issues as well as weakness he will need strengthening .  PAST MEDICAL HISTORY/PROBLEM LIST:  Failure to thrive/functional decline. He had a work-up, including a suppressed PTH of 5.5. His SPEP and UPEP did not show M-spikes. He had a 25-hydroxy D level of 31. His 125-hydroxy vitamin D level was 73, which is just above the upper level of the normal range. As mentioned, his albumin was 3.  Hypercalcemia. At discharge, it was 9.6. He was given one dose of pamidronate on 11/02/2013. Stated that this was related to chronic kidney disease, although  Could be  other factors at play here for chronic renal failure to cause hypercalcemia.  Heart murmur. This shows aortic valve sclerosis without  stenosis. His EF was 60-65%.  Hypertension. Hydralazine, nifedipine, and labetalol were added--sugars are somewhat variable with systolics ranging from 527-782U systolically-in the 23N and 36R diastolically.  Low-grade B-cell lymphoma. Medical Oncology consulted as following this it appears he just had a visit yesterday--appears conclusion was that he was stable calcium has been stable.  Chronic renal failure stage III.--this has been stable during his stay here with creatinine hovering around 2  Internal carotid stenosis. Carotid dopplers from May 30th showed right internal carotid artery stenosis at 50-69% and left internal carotid stenosis at 70%. MRI on this admission showed no CVA. Outpatient Vascular Surgery, he apparently has seen Dr. Donnetta Hutching in the past.  Pneumonia. Successfully treated.  CURRENT MEDICATIONS: Discharge medications include:  Proventil 3 mL by nebulizer every 4 hours.  Xanax 0.5 four times daily.  Plavix 75 q.d.  Cardura 4 mg daily.  Colace 100 b.i.d.  Lasix 40 b.i.d.  Hydralazine 50 every 8 hours.  Labetalol 200 two times a day. Mevacor 40 mg daily.  Procardia XL 90 mg daily.  Protonix 40 q.d.  Flomax 0.4 q.d.  Effexor XR 150 q.d.  SOCIAL HISTORY:  HOUSING: The patient t lives in [place] with his wife.  F.  FAMILY HISTORY: Positive for coronary artery disease.   REVIEW OF SYSTEMS Gen. no complaints of fever chills:  HEENT: The patient denies headache or sore throat has been treated for  thrush.  CHEST/RESPIRATORY: No cough. No sputum. No shortness of breath  CARDIAC: No chest pain--.  GI: Does not complaining of any abdominal pain nausea vomiting diarrhea or constipation had complained of constipation previously Muscle skeletal is not complaining of joint pain he is receiving hydrocodone apparently with relief-he has progressed apparently some with therapy although initially was resistant Neurologic does not complaining of dizziness headache or  numbness.  Psych history depression--anxiety-- that appears to be in good spirits this evening I suspect part of that is due to him going home .  Marland Kitchen    PHYSICAL EXAMINATION:   Temperature 98.2 pulse 81 respirations 20 blood pressure 150/62-107/64-in this range the past few days --weight is 161.6 this is down about 5 pounds the past 2 weeks GENERAL APPEARANCE: The patient is alert, conversational. Somewhat hard of hearing His skin is warm and dry he does have some chronic bruising most noticeable Ares bilaterally.  HEENT:  MOUTH/THROAT: Mucous membranes are moist still has a slight white film on the tongue question thrush  CHEST/RESPIRATORY: Clear air entry bilaterally. Somewhat reduced breath sounds there is no labored breathing  CARDIOVASCULAR:  CARDIAC: Heart sounds are normal. There is a systolic ejection murmur that does not radiate. He has some mild edema left leg he says this is chronic pedal pulse is intact GASTROINTESTINAL:  ABDOMEN: Distended. Surgical scars noted it is soft bowel sounds are positive.  LIVER/SPLEEN/KIDNEYS: His liver is not palpable. There is a spleen tip palpable.  GENITOURINARY:  BLADDER: Could not really appreciate any distention or tenderness.  Musculoskeletal-is able to rise to a standing position without assistance and ambulate however without assistance gait is quite unsteady.  Neurologic could not really appreciate any lateralizing findings or focal deficits speech is clear.  Psych he appears grossly alert and oriented pleasant and appropriate.  Labs.  11/08/2013.  WBC 5.7 hemoglobin 10.0 platelets 191.  Sodium 141 potassium 3.6 BUN 32 creatinine 2.0 several-calcium 9.3   Liver function tests within normal limits except bilirubin 0.2-albumin of 2.8    .  ASSESSMENT/PLAN:  Failure to thrive/functional decline, described in the hospital. He was found to be hypercalcemic with a suppressed PTH.  would think this is most likely secondary to his  lymphoma. He has been treated with pamidronate-- hematology oncology is following this he saw them yesterday.  Chronic renal failure. This has been stable during his stay here most recent creatinine 2.0 BUN 32 .  Hypercalcemia. See discussion above.  Refractory hypertension--he is on numerous agents as noted above this appears to be variable I do not see consistent elevations however.  Anemia. His hemoglobin is 10-this is stable. He had iron studies showing an iron of 14, a TIBC of 241, a ferritin level of 110, and a saturation of 6. This is likely iron deficiency.  Cerebral artery stenosis. On Plavix, medical management.. History of depression and anxiety-she is on Effexor as well as Xanax 4 times a day at this point appears stable will defer to primary care provider for followup Thrush?-Apparently this is improving he continues on nystatin oral.  Of note we'll obtain a CBC and BMP before discharge I note he is on Lasix would like to make sure renal function is stable as well as calcium level.  Will be going home with his wife who is quite supportive but will need home health for followup of his medical issues as well as weakness also would benefit from continued physical and occupational therapy   [id number]-of note greater than 30 minutes spent on this discharge summary

## 2013-11-20 ENCOUNTER — Other Ambulatory Visit: Payer: Self-pay | Admitting: Family Medicine

## 2013-11-20 ENCOUNTER — Encounter (HOSPITAL_COMMUNITY): Payer: Self-pay | Admitting: Oncology

## 2013-11-20 ENCOUNTER — Encounter (HOSPITAL_COMMUNITY): Payer: Medicare Other | Attending: Oncology | Admitting: Oncology

## 2013-11-20 VITALS — BP 131/42 | HR 77 | Temp 98.5°F | Resp 16 | Wt 161.4 lb

## 2013-11-20 DIAGNOSIS — D649 Anemia, unspecified: Secondary | ICD-10-CM

## 2013-11-20 DIAGNOSIS — C8290 Follicular lymphoma, unspecified, unspecified site: Secondary | ICD-10-CM

## 2013-11-20 DIAGNOSIS — C8299 Follicular lymphoma, unspecified, extranodal and solid organ sites: Secondary | ICD-10-CM | POA: Insufficient documentation

## 2013-11-20 NOTE — Patient Instructions (Signed)
[place] Discharge Instructions  RECOMMENDATIONS MADE BY THE CONSULTANT AND ANY TEST RESULTS WILL BE SENT TO YOUR REFERRING PHYSICIAN.  EXAM FINDINGS BY THE PHYSICIAN TODAY AND SIGNS OR SYMPTOMS TO REPORT TO CLINIC OR PRIMARY PHYSICIAN: Exam and findings as discussed by Robynn Pane, PA-C.  You have some abnormal labs and we need to do some scans to see what might be going on and will see you back after the scans.  MEDICATIONS PRESCRIBED:  none  INSTRUCTIONS/FOLLOW-UP: Follow-up after scans.  Thank you for choosing [facility] to provide your oncology and hematology care.  To afford each patient quality time with our providers, please arrive at least 15 minutes before your scheduled appointment time.  With your help, our goal is to use those 15 minutes to complete the necessary work-up to ensure our physicians have the information they need to help with your evaluation and healthcare recommendations.    Effective January 1st, 2014, we ask that you re-schedule your appointment with our physicians should you arrive 10 or more minutes late for your appointment.  We strive to give you quality time with our providers, and arriving late affects you and other patients whose appointments are after yours.    Again, thank you for choosing [facility].  Our hope is that these requests will decrease the amount of time that you wait before being seen by our physicians.       _____________________________________________________________  Should you have questions after your visit to [facility], please contact our office at [phone] between the hours of 8:30 a.m. and 4:30 p.m.  Voicemails left after 4:30 p.m. will not be returned until the following business day.  For prescription refill requests, have your pharmacy contact our office with your prescription refill request.     _______________________________________________________________  We hope that we have given you very good care.  You may receive a patient satisfaction survey in the mail, please complete it and return it as soon as possible.  We value your feedback!  _______________________________________________________________  Have you asked about our STAR program?  STAR stands for Survivorship Training and Rehabilitation, and this is a nationally recognized cancer care program that focuses on survivorship and rehabilitation.  Cancer and cancer treatments may cause problems, such as, pain, making you feel tired and keeping you from doing the things that you need or want to do. Cancer rehabilitation can help. Our goal is to reduce these troubling effects and help you have the best quality of life possible.  You may receive a survey from a nurse that asks questions about your current state of health.  Based on the survey results, all eligible patients will be referred to the [facility] program for an evaluation so we can better serve you!  A frequently asked questions sheet is available upon request.

## 2013-11-21 ENCOUNTER — Telehealth: Payer: Self-pay | Admitting: *Deleted

## 2013-11-21 MED ORDER — ALBUTEROL SULFATE HFA 108 (90 BASE) MCG/ACT IN AERS: 2.0000 | INHALATION_SPRAY | Freq: Four times a day (QID) | RESPIRATORY_TRACT | Status: AC | PRN

## 2013-11-21 NOTE — Telephone Encounter (Signed)
Received call from patient wife, Stanton Kidney.   Reports that patient was discharged from [facility] with new orders for Albuterol Nebulizer treatments, but no machine was ordered. No medicare appropriate diagnosis noted.   Requested to have medication changed to Albuterol inhaler as needed for SOB or wheezing.   MD please advise.

## 2013-11-21 NOTE — Telephone Encounter (Signed)
Okay to to change to ventolin HFA 2 puff inh q 6 hrs prn

## 2013-11-21 NOTE — Telephone Encounter (Signed)
Med sent to pharm and [facility] aware

## 2013-11-25 ENCOUNTER — Encounter: Payer: Self-pay | Admitting: Vascular Surgery

## 2013-11-26 ENCOUNTER — Ambulatory Visit (HOSPITAL_COMMUNITY)
Admission: RE | Admit: 2013-11-26 | Discharge: 2013-11-26 | Disposition: A | Discharge status: Home / Self Care | Payer: Medicare Other | Source: Ambulatory Visit | Attending: Family Medicine | Admitting: Family Medicine

## 2013-11-26 ENCOUNTER — Encounter: Payer: Self-pay | Admitting: Family Medicine

## 2013-11-26 ENCOUNTER — Encounter: Payer: Medicare Other | Admitting: Vascular Surgery

## 2013-11-26 ENCOUNTER — Ambulatory Visit (INDEPENDENT_AMBULATORY_CARE_PROVIDER_SITE_OTHER): Payer: Medicare Other | Admitting: Family Medicine

## 2013-11-26 ENCOUNTER — Encounter: Payer: Self-pay | Admitting: Vascular Surgery

## 2013-11-26 VITALS — BP 130/56 | HR 88 | Temp 98.4°F | Resp 20 | Wt 163.0 lb

## 2013-11-26 DIAGNOSIS — I517 Cardiomegaly: Secondary | ICD-10-CM | POA: Insufficient documentation

## 2013-11-26 DIAGNOSIS — Z09 Encounter for follow-up examination after completed treatment for conditions other than malignant neoplasm: Secondary | ICD-10-CM

## 2013-11-26 DIAGNOSIS — R0609 Other forms of dyspnea: Secondary | ICD-10-CM

## 2013-11-26 DIAGNOSIS — R0600 Dyspnea, unspecified: Secondary | ICD-10-CM

## 2013-11-26 DIAGNOSIS — R0602 Shortness of breath: Secondary | ICD-10-CM | POA: Insufficient documentation

## 2013-11-26 DIAGNOSIS — R0989 Other specified symptoms and signs involving the circulatory and respiratory systems: Secondary | ICD-10-CM

## 2013-11-26 LAB — BASIC METABOLIC PANEL
BUN: 27 mg/dL — ABNORMAL HIGH (ref 6–23)
CO2: 24 meq/L (ref 19–32)
Calcium: 9.1 mg/dL (ref 8.4–10.5)
Chloride: 101 mEq/L (ref 96–112)
Creat: 1.9 mg/dL — ABNORMAL HIGH (ref 0.50–1.35)
Glucose, Bld: 94 mg/dL (ref 70–99)
Potassium: 5 mEq/L (ref 3.5–5.3)
SODIUM: 140 meq/L (ref 135–145)

## 2013-11-26 NOTE — Progress Notes (Signed)
Subjective:    Patient ID: Ricardo Schaefer, male    DOB: [birth year], 80 y.o.   MRN: [id number]  HPI Patient was recently hospitalized due to profound weakness and failure to thrive. He stopped drinking and eating. He could no longer walk. His wife called EMS and was he was evaluated in the emergency room. He was found have an elevated calcium level 11.8.  He was treated with pamidronate x 1 in hospital, and discharged on Lasix 40 mg by mouth twice a day to a skilled nursing facility for physical therapy. He was also treated for right lower lobe pneumonia with Levaquin while in the hospital.  He stayed at the nursing facility for 2 weeks and at his own request, he was discharged home.  Since going home, he is progressively weakened.  He is now wheelchair-bound and unable to stand or back. He is not eating and drinking well. He is reporting increasing shortness of breath. His pulse oximetry is typically 91-92% while sitting on room air. Activity it frequently drops to 87% on room air. At night his pulse oximetry it is dropping to 84 and 85% on room air while he is asleep. He reports orthopnea and paroxysmal nocturnal dyspnea. Today on oxygen his pulse oximetry was greater than 90% with ambulation on 2 L via nasal cannula. Head and neck, there has not been a definitive diagnosis as to is causing his weakness his hypercalcemia. He has a history of non-Hodgkin's lymphoma. His oncologist as scheduled him for a CT scan to stage his lymphoma next week. My concern is that his lymphoma has significantly worsened causing his hypercalcemia and his weakness. I am also concerned that his shortness of breath may be due to residual pneumonia or possibly pulmonary edema. The patient discontinued Lasix on his iron after his discharge from the nursing home. He has not taken Lasix since discharge from the nursing home. There is no significant edema in his legs. The patient has no crackles on his pulmonary exam. He does have a  distended abdomen although no obvious ascites.  The abdominal distention is likely due to the lymphoma. Past Medical History  Diagnosis Date  . Coronary atherosclerosis of native coronary artery     Occluded RCA with collaterals, DES to LAD and circumflex 2005  . Essential hypertension, benign   . Non Hodgkin's lymphoma   . Chronic renal insufficiency   . Hyperlipidemia   . Degenerative disc disease, lumbar   . Duodenal ulcer     EGD 6/11 - iron deficiency anemia  . Pericardial effusion     Status post window  . Carotid artery disease     Nonobstructive, less than 22% LICA by angiography 9/79  . Stroke     Occurred 1999, had left atrial appendage thrombus at that time - previously on Coumadin  . Peripheral arterial disease   . Enlarged prostate   . Lymphoma   . Melanoma    Past Surgical History  Procedure Laterality Date  . Cholecystectomy    . Pericardial window    . Aortobifemoral bypass grafting  2005  . Left femoral popliteal bypass grafting  2005  . Coronary angioplasty with stent placement     Current Outpatient Prescriptions on File Prior to Visit  Medication Sig Dispense Refill  . albuterol (PROVENTIL HFA;VENTOLIN HFA) 108 (90 BASE) MCG/ACT inhaler Inhale 2 puffs into the lungs every 6 (six) hours as needed for wheezing or shortness of breath.  1 Inhaler  4  . ALPRAZolam (  XANAX) 0.5 MG tablet Take 0.5 mg by mouth 4 (four) times daily.      . clopidogrel (PLAVIX) 75 MG tablet Take 75 mg by mouth daily with breakfast.      . docusate sodium 100 MG CAPS Take 100 mg by mouth 2 (two) times daily.  10 capsule  0  . doxazosin (CARDURA) 4 MG tablet TAKE ONE TABLET BY MOUTH DAILY.  30 tablet  3  . HYDROcodone-acetaminophen (NORCO/VICODIN) 5-325 MG per tablet Take one tablet by mouth every 6 hours as needed for pain  120 tablet  0  . lovastatin (MEVACOR) 40 MG tablet Take 40 mg by mouth at bedtime.      . pantoprazole (PROTONIX) 40 MG tablet Take 40 mg by mouth daily.      .  tamsulosin (FLOMAX) 0.4 MG CAPS capsule Take 0.4 mg by mouth at bedtime.      Marland Kitchen venlafaxine XR (EFFEXOR-XR) 150 MG 24 hr capsule TAKE ONE CAPSULE BY MOUTH ONCE EVERY MORNING.  30 capsule  3   No current facility-administered medications on file prior to visit.   Allergies  Allergen Reactions  . Aspirin     Causes stomach bleeds   History   Social History  . Marital Status: Married    Spouse Name: N/A    Number of Children: N/A  . Years of Education: N/A   Occupational History  . Retired    Social History Main Topics  . Smoking status: Current Every Day Smoker -- 0.50 packs/day for 75 years    Types: Cigarettes  . Smokeless tobacco: Never Used  . Alcohol Use: No  . Drug Use: No  . Sexual Activity: Not on file   Other Topics Concern  . Not on file   Social History Narrative  . No narrative on file      Review of Systems  All other systems reviewed and are negative.      Objective:   Physical Exam  Vitals reviewed. Constitutional: He appears well-developed. He appears cachectic. He has a sickly appearance.  Eyes: Conjunctivae are normal. No scleral icterus.  Neck: Neck supple. No JVD present.  Cardiovascular: Normal rate and regular rhythm.   Murmur heard. Pulmonary/Chest: Effort normal. No respiratory distress. He has wheezes. He has no rales.  Abdominal: Soft. He exhibits distension. There is no tenderness. There is no rebound and no guarding.  Musculoskeletal: He exhibits no edema.  Lymphadenopathy:    He has no cervical adenopathy.  Neurological: He is alert.  Skin: He is not diaphoretic.          Assessment & Plan:  Dyspnea - Plan: DG Chest 2 View  Hypercalcemia - Plan: [facility] discharge follow-up - Plan: Ambulatory referral to [place]   I anticipate that the patient's lymphoma has likely worsened and this is causing his failure to thrive, his hypercalcemia, and his weakness. Therefore I will await the results of  the CT scan next week. If his lymphoma is stable, I would recommend home health physical therapy to improve his weakness so he can ambulate. I'm also going to order the patient oxygen at home 2 L via nasal cannula due to his hypoxia. I also ordered a chest x-ray to evaluate for pulmonary edema or residual pneumonia. If chest x-ray shows pulmonary edema OR his BMP shows hypercalcemia, I will resume lasix.  Otherwise, on his physical exam today, there is no obvious fluid overload requiring diuretic.  If the CT scan  shows worsening tumor burden, defer physical therapy until the patient has made a decision with his oncologist regarding his treatment options.

## 2013-11-26 NOTE — Progress Notes (Signed)
   Subjective:    Patient ID: Ricardo Schaefer, male    DOB: August 17, 1926, 80 y.o.   MRN: [id number]  HPI HPI should state that with activity spo2 drops to 87 on room air and recovers to 90 with 2 liters oxygen via nasal cannula while in office.   Review of Systems     Objective:   Physical Exam        Assessment & Plan:

## 2013-11-29 ENCOUNTER — Emergency Department (HOSPITAL_COMMUNITY)
Admission: EM | Admit: 2013-11-29 | Discharge: 2013-11-29 | Disposition: A | Discharge status: Hospice | Payer: Medicare Other | Attending: Emergency Medicine | Admitting: Emergency Medicine

## 2013-11-29 ENCOUNTER — Encounter (HOSPITAL_COMMUNITY): Payer: Self-pay | Admitting: Emergency Medicine

## 2013-11-29 DIAGNOSIS — R0602 Shortness of breath: Secondary | ICD-10-CM | POA: Insufficient documentation

## 2013-11-29 DIAGNOSIS — I2510 Atherosclerotic heart disease of native coronary artery without angina pectoris: Secondary | ICD-10-CM | POA: Insufficient documentation

## 2013-11-29 DIAGNOSIS — M5137 Other intervertebral disc degeneration, lumbosacral region: Secondary | ICD-10-CM | POA: Insufficient documentation

## 2013-11-29 DIAGNOSIS — R627 Adult failure to thrive: Secondary | ICD-10-CM | POA: Insufficient documentation

## 2013-11-29 DIAGNOSIS — N189 Chronic kidney disease, unspecified: Secondary | ICD-10-CM | POA: Insufficient documentation

## 2013-11-29 DIAGNOSIS — E785 Hyperlipidemia, unspecified: Secondary | ICD-10-CM | POA: Insufficient documentation

## 2013-11-29 DIAGNOSIS — M51379 Other intervertebral disc degeneration, lumbosacral region without mention of lumbar back pain or lower extremity pain: Secondary | ICD-10-CM | POA: Insufficient documentation

## 2013-11-29 DIAGNOSIS — Z8589 Personal history of malignant neoplasm of other organs and systems: Secondary | ICD-10-CM | POA: Insufficient documentation

## 2013-11-29 DIAGNOSIS — F17200 Nicotine dependence, unspecified, uncomplicated: Secondary | ICD-10-CM | POA: Insufficient documentation

## 2013-11-29 DIAGNOSIS — Z8719 Personal history of other diseases of the digestive system: Secondary | ICD-10-CM | POA: Insufficient documentation

## 2013-11-29 DIAGNOSIS — I129 Hypertensive chronic kidney disease with stage 1 through stage 4 chronic kidney disease, or unspecified chronic kidney disease: Secondary | ICD-10-CM | POA: Insufficient documentation

## 2013-11-29 DIAGNOSIS — Z85820 Personal history of malignant melanoma of skin: Secondary | ICD-10-CM | POA: Insufficient documentation

## 2013-11-29 DIAGNOSIS — Z8709 Personal history of other diseases of the respiratory system: Secondary | ICD-10-CM | POA: Insufficient documentation

## 2013-11-29 DIAGNOSIS — Z8673 Personal history of transient ischemic attack (TIA), and cerebral infarction without residual deficits: Secondary | ICD-10-CM | POA: Insufficient documentation

## 2013-11-29 DIAGNOSIS — Z7902 Long term (current) use of antithrombotics/antiplatelets: Secondary | ICD-10-CM | POA: Insufficient documentation

## 2013-11-29 DIAGNOSIS — Z8701 Personal history of pneumonia (recurrent): Secondary | ICD-10-CM | POA: Insufficient documentation

## 2013-11-29 DIAGNOSIS — Z79899 Other long term (current) drug therapy: Secondary | ICD-10-CM | POA: Insufficient documentation

## 2013-11-29 DIAGNOSIS — Z9861 Coronary angioplasty status: Secondary | ICD-10-CM | POA: Insufficient documentation

## 2013-11-29 DIAGNOSIS — Z87898 Personal history of other specified conditions: Secondary | ICD-10-CM | POA: Insufficient documentation

## 2013-11-29 DIAGNOSIS — I739 Peripheral vascular disease, unspecified: Secondary | ICD-10-CM | POA: Insufficient documentation

## 2013-11-29 DIAGNOSIS — R630 Anorexia: Secondary | ICD-10-CM | POA: Insufficient documentation

## 2013-11-29 NOTE — Discharge Instructions (Signed)
Go to hospice of [facility].

## 2013-11-29 NOTE — ED Provider Notes (Signed)
CSN: [id number]     Arrival date & time 11/29/13  6295 History  This chart was scribed for Nat Christen, MD by Ludger Nutting, ED Scribe. This patient was seen in room APA04/APA04 and the patient's care was started 7:28 AM.    Chief Complaint  Patient presents with  . Shortness of Breath  . Failure To Thrive   The history is provided by the patient and the spouse. No language interpreter was used.   LEVEL 5 CAVEAT for AMS HPI Comments: Ricardo Schaefer is a 80 y.o. male with past medical history of non-Hodgkin's lymphoma, melanoma, HLD, CAD, peripheral arterial disease who presents to the Emergency Department complaining of constant, gradually worsened SOB that began over 1 month ago. Wife states patient began using constant at-home oxygen for the past 3 days. She states patient has decreased appetite and thirst as well has black colored urine output. Per wife, patient was seen last month after which he developed pneumonia. She states patient has generally not been doing well recently.   PCP Pickard in [employer]  Past Medical History  Diagnosis Date  . Coronary atherosclerosis of native coronary artery     Occluded RCA with collaterals, DES to LAD and circumflex 2005  . Essential hypertension, benign   . Non Hodgkin's lymphoma   . Chronic renal insufficiency   . Hyperlipidemia   . Degenerative disc disease, lumbar   . Duodenal ulcer     EGD 6/11 - iron deficiency anemia  . Pericardial effusion     Status post window  . Carotid artery disease     Nonobstructive, less than 28% LICA by angiography 4/13  . Stroke     Occurred 1999, had left atrial appendage thrombus at that time - previously on Coumadin  . Peripheral arterial disease   . Enlarged prostate   . Lymphoma   . Melanoma    Past Surgical History  Procedure Laterality Date  . Cholecystectomy    . Pericardial window    . Aortobifemoral bypass grafting  2005  . Left femoral popliteal bypass grafting  2005  . Coronary  angioplasty with stent placement     Family History  Problem Relation Age of Onset  . COPD Mother   . Coronary artery disease Mother    History  Substance Use Topics  . Smoking status: Current Every Day Smoker -- 0.50 packs/day for 75 years    Types: Cigarettes  . Smokeless tobacco: Never Used  . Alcohol Use: No    Review of Systems  Unable to perform ROS: Other    Allergies  Aspirin  Home Medications   Prior to Admission medications   Medication Sig Start Date End Date Taking? Authorizing Provider  albuterol (PROVENTIL HFA;VENTOLIN HFA) 108 (90 BASE) MCG/ACT inhaler Inhale 2 puffs into the lungs every 6 (six) hours as needed for wheezing or shortness of breath. 11/21/13   Susy Frizzle, MD  ALPRAZolam Duanne Moron) 0.5 MG tablet Take 0.5 mg by mouth 4 (four) times daily.    Historical Provider, MD  clopidogrel (PLAVIX) 75 MG tablet Take 75 mg by mouth daily with breakfast.    Historical Provider, MD  docusate sodium 100 MG CAPS Take 100 mg by mouth 2 (two) times daily. 11/05/13   Hosie Poisson, MD  doxazosin (CARDURA) 4 MG tablet TAKE ONE TABLET BY MOUTH DAILY. 11/20/13   Susy Frizzle, MD  hydrALAZINE (APRESOLINE) 25 MG tablet Take 25 mg by mouth 3 (three) times daily.  Historical Provider, MD  HYDROcodone-acetaminophen (NORCO/VICODIN) 5-325 MG per tablet Take one tablet by mouth every 6 hours as needed for pain 11/18/13   Tiffany L Reed, DO  lovastatin (MEVACOR) 40 MG tablet Take 40 mg by mouth at bedtime.    Historical Provider, MD  metoprolol (LOPRESSOR) 50 MG tablet Take 50 mg by mouth 2 (two) times daily.    Historical Provider, MD  NIFEdipine (PROCARDIA XL/ADALAT-CC) 60 MG 24 hr tablet Take 60 mg by mouth daily.    Historical Provider, MD  pantoprazole (PROTONIX) 40 MG tablet Take 40 mg by mouth daily.    Historical Provider, MD  tamsulosin (FLOMAX) 0.4 MG CAPS capsule Take 0.4 mg by mouth at bedtime.    Historical Provider, MD  venlafaxine XR (EFFEXOR-XR) 150 MG 24 hr  capsule TAKE ONE CAPSULE BY MOUTH ONCE EVERY MORNING. 11/20/13   Susy Frizzle, MD   BP 161/51  Pulse 94  Temp(Src) 97.7 F (36.5 C) (Oral)  Resp 22  SpO2 93% Physical Exam  Nursing note and vitals reviewed. HENT:  Head: Normocephalic and atraumatic.  Eyes: Conjunctivae and EOM are normal. Pupils are equal, round, and reactive to light.  Neck: Neck supple.  Cardiovascular: Normal rate, regular rhythm and normal heart sounds.   Pulmonary/Chest: Effort normal and breath sounds normal.  Abdominal: Soft. Bowel sounds are normal.  Neurological: He is alert.  Skin: Skin is warm and dry.  Psychiatric: He has a normal mood and affect. His behavior is normal.    ED Course  Procedures (including critical care time)  DIAGNOSTIC STUDIES: Oxygen Saturation is 96% on RA, adequate by my interpretation.    COORDINATION OF CARE: 7:38 AM Discussed hospice care with patient's wife and she agrees to plan.    Labs Review Labs Reviewed - No data to display  Imaging Review No results found.   EKG Interpretation None      MDM   Final diagnoses:  FTT (failure to thrive) in adult    Discussed patient's terminal condition with patient and his wife. They are amenable to hospice care. Will arrange transfer to hospice today.  I personally performed the services described in this documentation, which was scribed in my presence. The recorded information has been reviewed and is accurate.   Nat Christen, MD 11/29/13 [phone]

## 2013-11-29 NOTE — ED Notes (Signed)
Discussed Hospice with wife. Wife agreed that it was time for Hospice and didn't know if she wanted to talk with pt about it or just do it. Pt educated on Hospice. Advised edp will see him asap and will discuss further.

## 2013-11-29 NOTE — ED Notes (Signed)
EDP, wife of pt and I talking about Hospice. Wife states she would like to take him to the Hospice Home. States she is unable to care for him or lift him. Comfort measures given. Hospice called and RN on way to assess pt for Hospice Home. Wife advised.

## 2013-11-29 NOTE — ED Notes (Signed)
Pt wife requested pt's pcp to be called and notified was going to Hospice. Called and spoke with Vikki Ports to notify pt has went to [facility]

## 2013-11-29 NOTE — ED Notes (Signed)
Per EMS pt. Became short of breath last night. Reports pt. Has little PO intake and minimal output. EMS reports wife stated urine output black.

## 2013-12-02 ENCOUNTER — Inpatient Hospital Stay: Payer: Medicare Other | Admitting: Family Medicine

## 2013-12-02 ENCOUNTER — Ambulatory Visit (HOSPITAL_COMMUNITY): Admit: 2013-12-02 | Payer: Medicare Other

## 2013-12-05 ENCOUNTER — Ambulatory Visit (HOSPITAL_COMMUNITY): Payer: Medicare Other | Admitting: Oncology

## 2013-12-09 ENCOUNTER — Inpatient Hospital Stay: Payer: Medicare Other | Admitting: Family Medicine

## 2013-12-21 DEATH — deceased

## 2015-05-13 IMAGING — CT CT HEAD W/O CM
1 of 3 series · 15 of 30 positions shown, 19 images · non-contrast
Comparison: MRI of the brain August 26, 2013.

CLINICAL DATA: Trauma 3 days ago with left arm pain.

EXAM:
CT HEAD WITHOUT CONTRAST
TECHNIQUE: Contiguous axial images were obtained from the base of the skull
through the vertex without intravenous contrast.

[Series 4: headtrauma 2.4 h60s · axial · 0.46mm/px · z∈[+5,+157]mm · 15 of 72 slices shown, 19 images]
[im 5/72  brain]
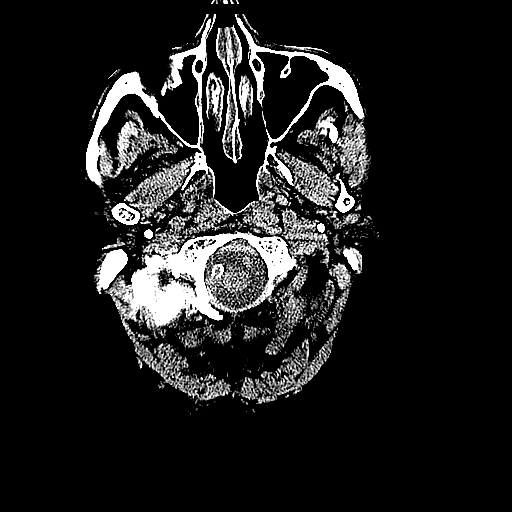
[im 5/72  bone]
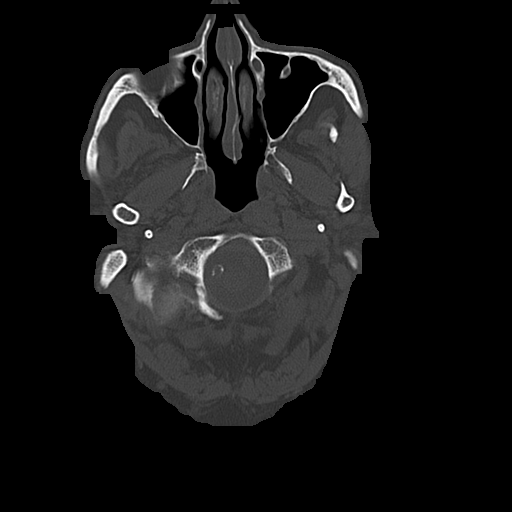
[im 9/72  brain]
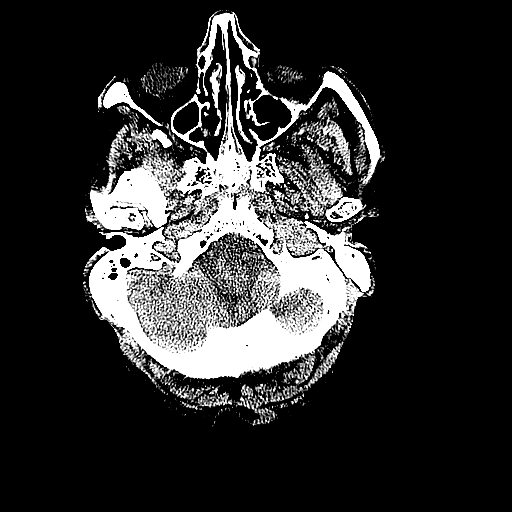
[im 14/72  brain]
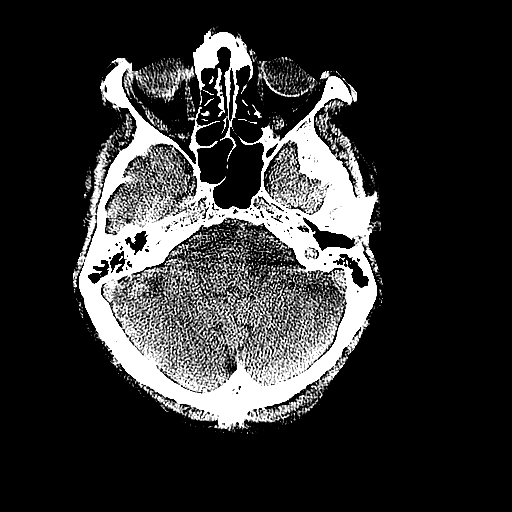
[im 18/72  brain]
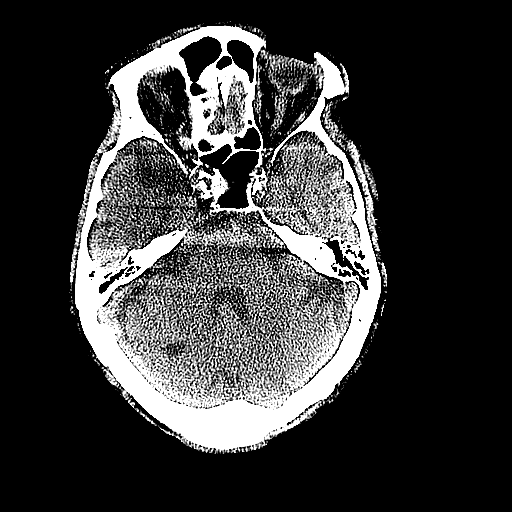
[im 23/72  brain]
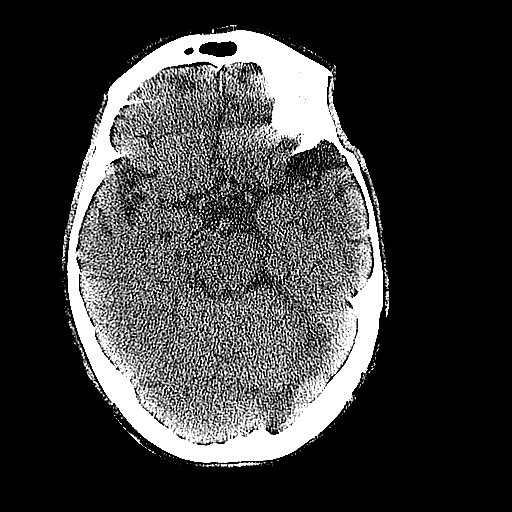
[im 23/72  bone]
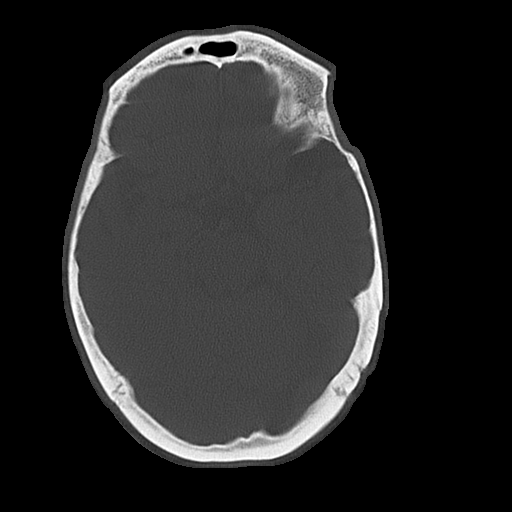
[im 27/72  brain]
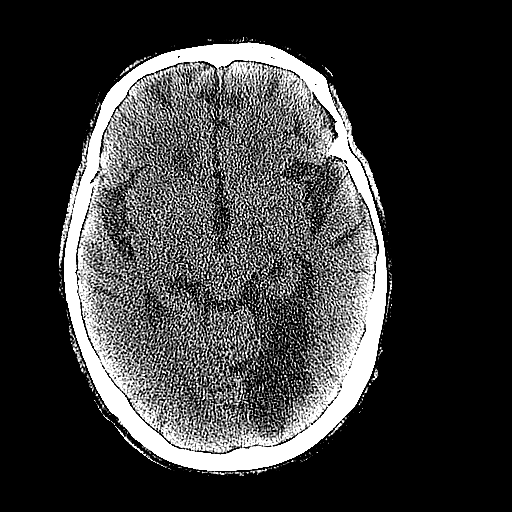
[im 32/72  brain]
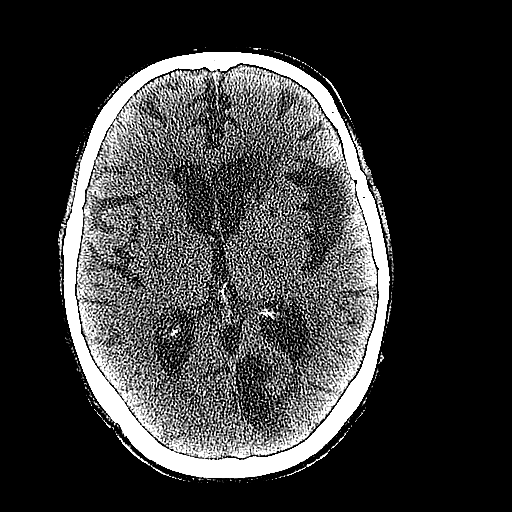
[im 36/72  brain]
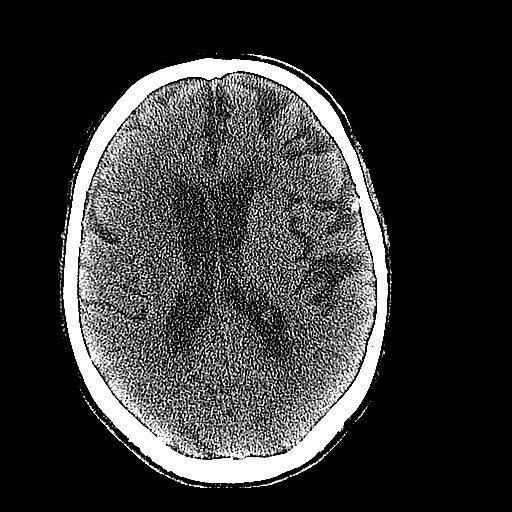
[im 40/72  brain]
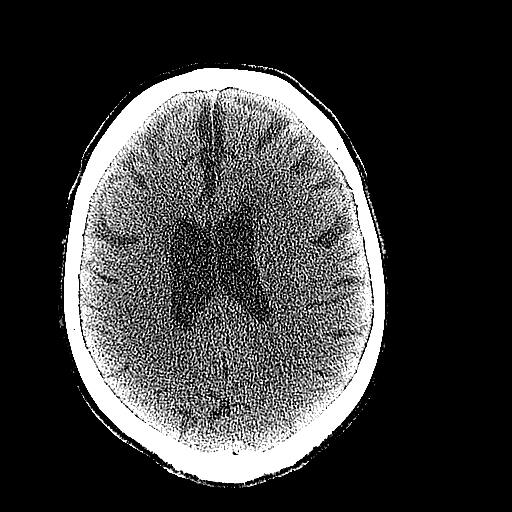
[im 40/72  bone]
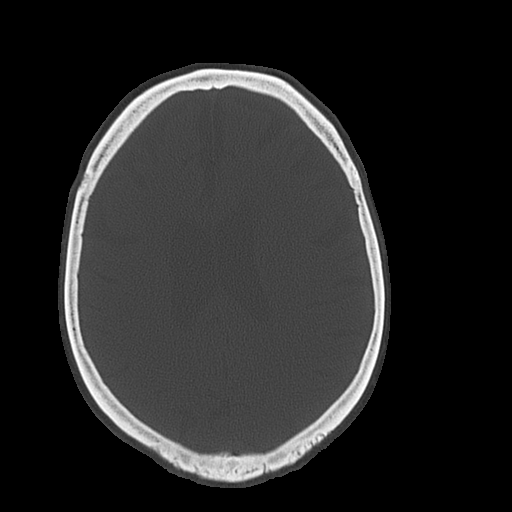
[im 45/72  brain]
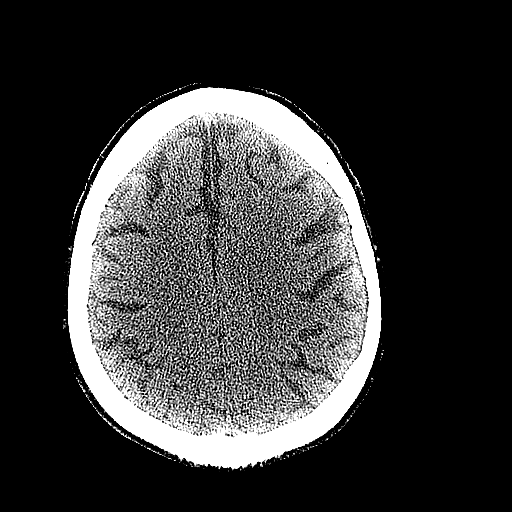
[im 49/72  brain]
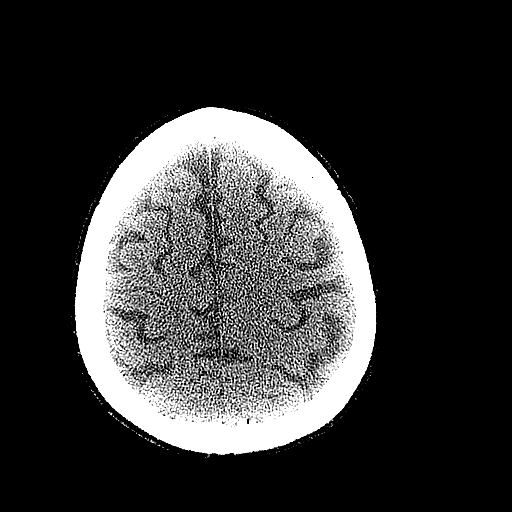
[im 54/72  brain]
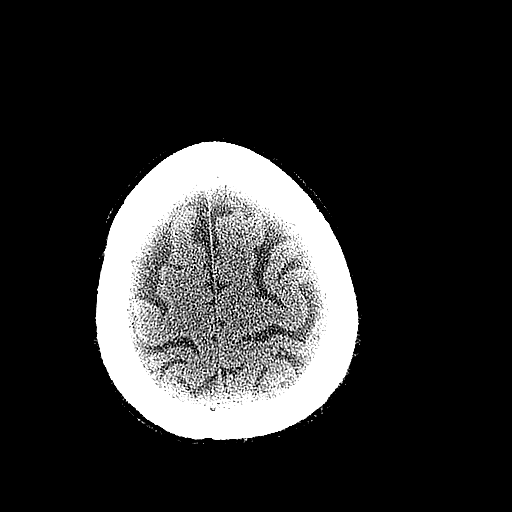
[im 58/72  brain]
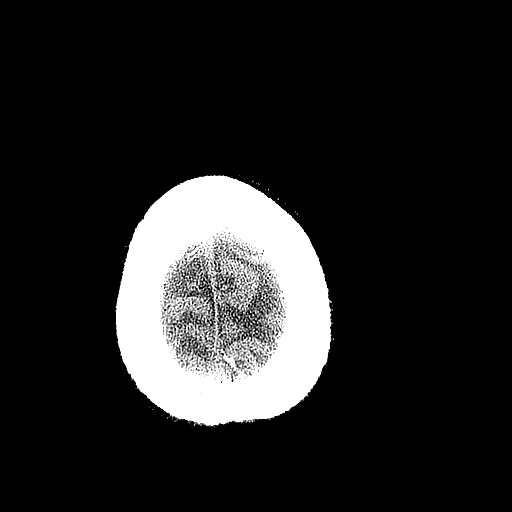
[im 58/72  bone]
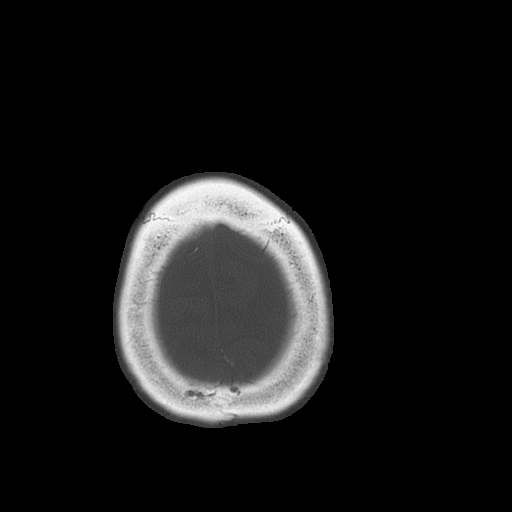
[im 63/72  brain]
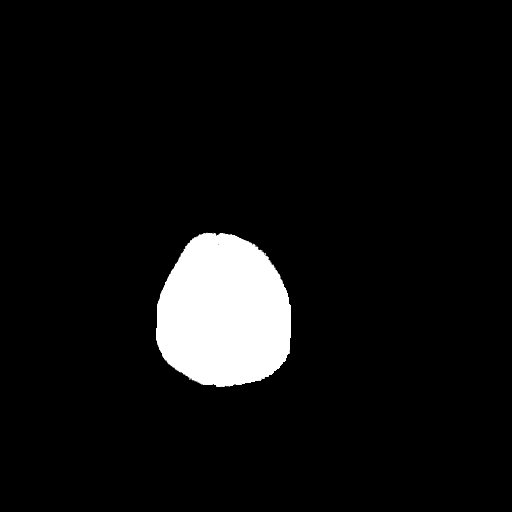
[im 67/72  brain]
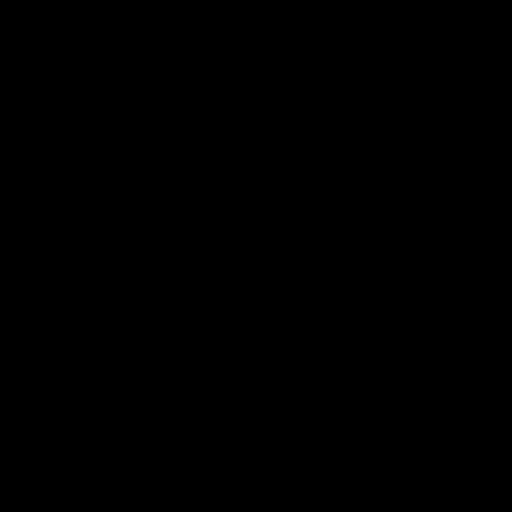

[15 of 30 positions shown; findings below may reference images not displayed]

FINDINGS: No intraparenchymal hemorrhage, mass effect or midline shift. Left
medial occipital lobe encephalomalacia, unchanged. Bilateral
subcentimeter basal ganglia remote lacunar infarcts. Remote right
cerebellar small infarct. The ventricles and sulci are overall
normal for patient's age. No acute large vascular territory infarct.
Patchy white matter hypodensities unchanged, suggest sequelae of
chronic small vessel ischemic disease, within normal range for
patient's age.

No abnormal extra-axial fluid collections. Moderate calcific
atherosclerosis of the carotid siphons and to lesser extent
vertebral arteries. Ocular globes and orbital contents are
nonsuspicious though not tailored for evaluation. No skull fracture.
Remote left medial orbital blowout fracture. Moderate right
temporomandibular osteoarthrosis. Mild paranasal sinus mucosal
thickening without air-fluid levels. Mastoid air cells are well
aerated.
IMPRESSION: No acute intracranial process.

Stable chronic changes, including remote left posterior cerebral
artery territory infarct.

  By: Rocco Nagar
